# Patient Record
Sex: Female | Born: 1960
Health system: Southern US, Community
[De-identification: ages and names within clinical notes are randomized; demographics above are authoritative.]

## PROBLEM LIST (undated history)

## (undated) DIAGNOSIS — F410 Panic disorder [episodic paroxysmal anxiety] without agoraphobia: Secondary | ICD-10-CM

## (undated) DIAGNOSIS — A048 Other specified bacterial intestinal infections: Secondary | ICD-10-CM

## (undated) DIAGNOSIS — G43909 Migraine, unspecified, not intractable, without status migrainosus: Secondary | ICD-10-CM

## (undated) DIAGNOSIS — T8859XA Other complications of anesthesia, initial encounter: Secondary | ICD-10-CM

## (undated) DIAGNOSIS — M810 Age-related osteoporosis without current pathological fracture: Secondary | ICD-10-CM

## (undated) DIAGNOSIS — G4700 Insomnia, unspecified: Secondary | ICD-10-CM

## (undated) DIAGNOSIS — I10 Essential (primary) hypertension: Secondary | ICD-10-CM

## (undated) DIAGNOSIS — E559 Vitamin D deficiency, unspecified: Secondary | ICD-10-CM

## (undated) DIAGNOSIS — Z9889 Other specified postprocedural states: Secondary | ICD-10-CM

## (undated) DIAGNOSIS — M1990 Unspecified osteoarthritis, unspecified site: Secondary | ICD-10-CM

## (undated) DIAGNOSIS — G510 Bell's palsy: Secondary | ICD-10-CM

## (undated) DIAGNOSIS — M4120 Other idiopathic scoliosis, site unspecified: Secondary | ICD-10-CM

## (undated) DIAGNOSIS — K219 Gastro-esophageal reflux disease without esophagitis: Secondary | ICD-10-CM

## (undated) DIAGNOSIS — D638 Anemia in other chronic diseases classified elsewhere: Secondary | ICD-10-CM

## (undated) HISTORY — DX: Anemia in other chronic diseases classified elsewhere: D63.8

## (undated) HISTORY — DX: Other complications of anesthesia, initial encounter: T88.59XA

## (undated) HISTORY — DX: Insomnia, unspecified: G47.00

## (undated) HISTORY — DX: Other specified postprocedural states: Z98.890

## (undated) HISTORY — DX: Other specified bacterial intestinal infections: A04.8

## (undated) HISTORY — DX: Unspecified osteoarthritis, unspecified site: M19.90

## (undated) HISTORY — PX: GASTRIC BYPASS: SHX52

## (undated) HISTORY — DX: Gastro-esophageal reflux disease without esophagitis: K21.9

## (undated) HISTORY — PX: CHOLECYSTECTOMY: SHX55

## (undated) HISTORY — PX: BACK SURGERY: SHX140

## (undated) HISTORY — DX: Panic disorder (episodic paroxysmal anxiety): F41.0

## (undated) HISTORY — DX: Essential (primary) hypertension: I10

## (undated) HISTORY — PX: VAGINAL HYSTERECTOMY: SUR661

## (undated) HISTORY — DX: Bell's palsy: G51.0

## (undated) HISTORY — PX: TONSILLECTOMY: SUR1361

## (undated) HISTORY — DX: Age-related osteoporosis without current pathological fracture: M81.0

## (undated) HISTORY — DX: Vitamin D deficiency, unspecified: E55.9

## (undated) HISTORY — DX: Migraine, unspecified, not intractable, without status migrainosus: G43.909

## (undated) HISTORY — DX: Other idiopathic scoliosis, site unspecified: M41.20

---

## 2015-02-07 ENCOUNTER — Other Ambulatory Visit: Payer: Self-pay | Admitting: Family Medicine

## 2015-02-07 DIAGNOSIS — Z1231 Encounter for screening mammogram for malignant neoplasm of breast: Secondary | ICD-10-CM

## 2015-02-07 DIAGNOSIS — M810 Age-related osteoporosis without current pathological fracture: Secondary | ICD-10-CM

## 2015-03-07 ENCOUNTER — Other Ambulatory Visit: Payer: Self-pay

## 2015-03-07 ENCOUNTER — Ambulatory Visit: Payer: Self-pay

## 2015-03-10 ENCOUNTER — Ambulatory Visit
Admission: RE | Admit: 2015-03-10 | Discharge: 2015-03-10 | Disposition: A | Discharge status: Home / Self Care | Payer: 59 | Source: Ambulatory Visit | Attending: Family Medicine | Admitting: Family Medicine

## 2015-03-10 DIAGNOSIS — Z1231 Encounter for screening mammogram for malignant neoplasm of breast: Secondary | ICD-10-CM

## 2015-03-10 DIAGNOSIS — M810 Age-related osteoporosis without current pathological fracture: Secondary | ICD-10-CM

## 2015-03-10 IMAGING — MG MM SCREEN MAMMOGRAM BILATERAL
4 series · 4 of 4 positions shown · non-contrast
Comparison: Previous exam(s).

CLINICAL DATA: Screening.

EXAM:
DIGITAL SCREENING BILATERAL MAMMOGRAM WITH CAD

[R CC]
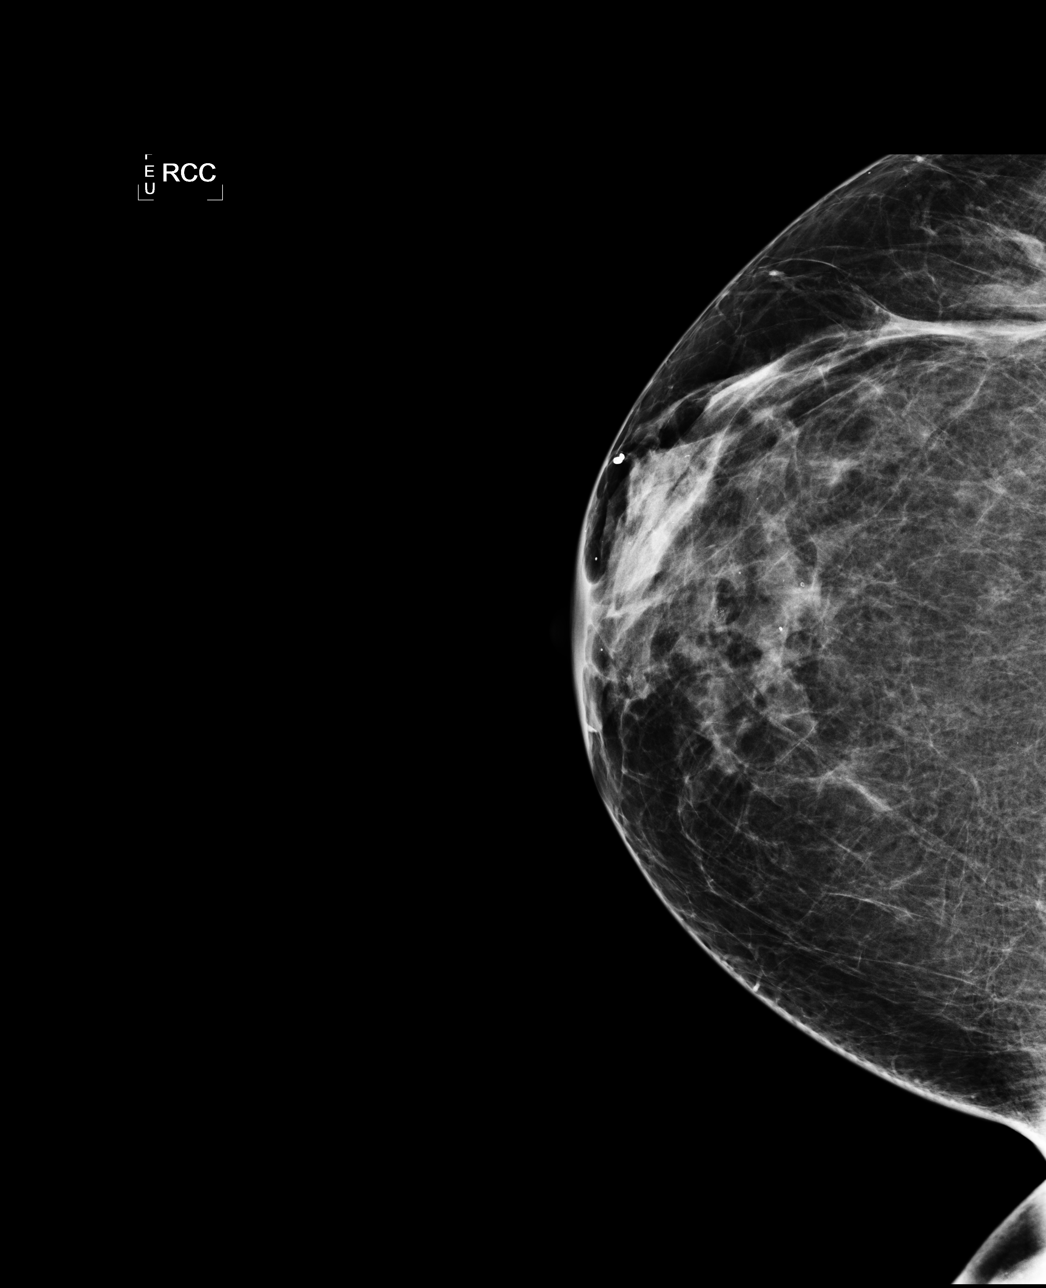

[L CC]
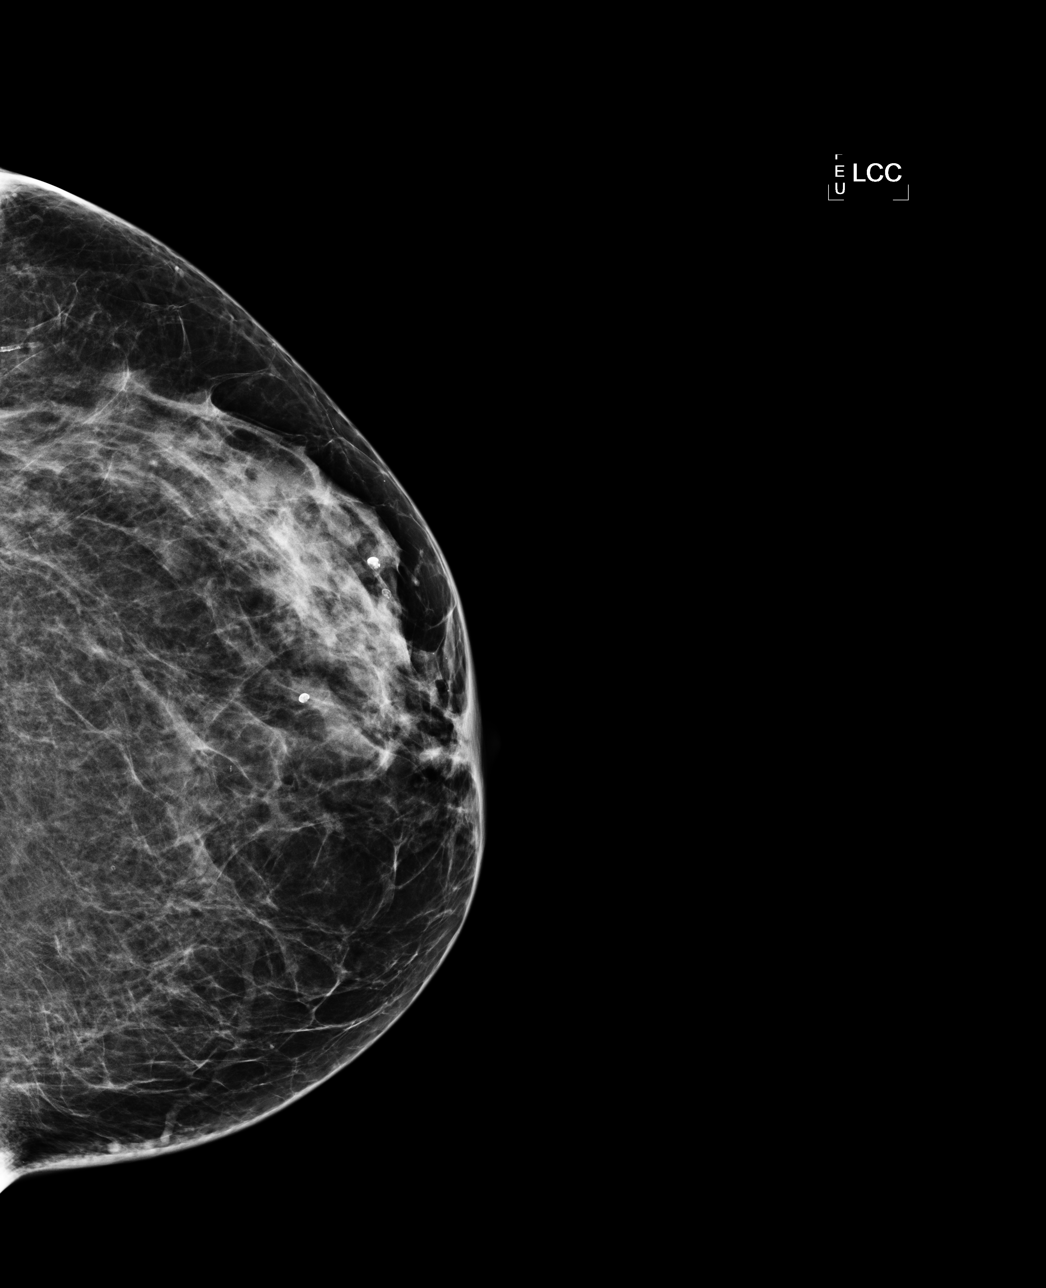

[L MLO]
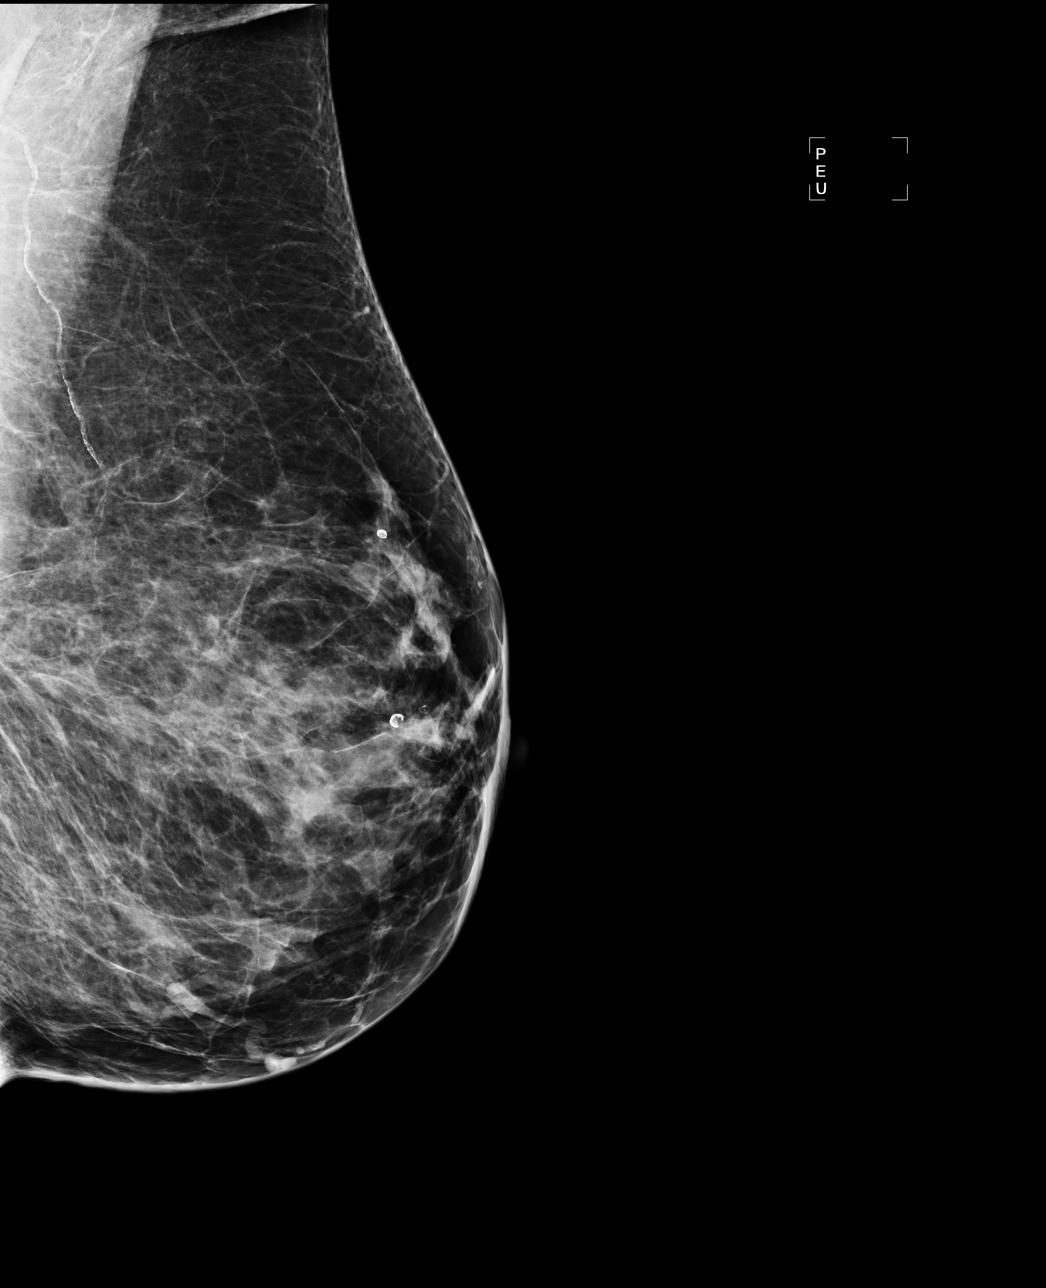

[R MLO]
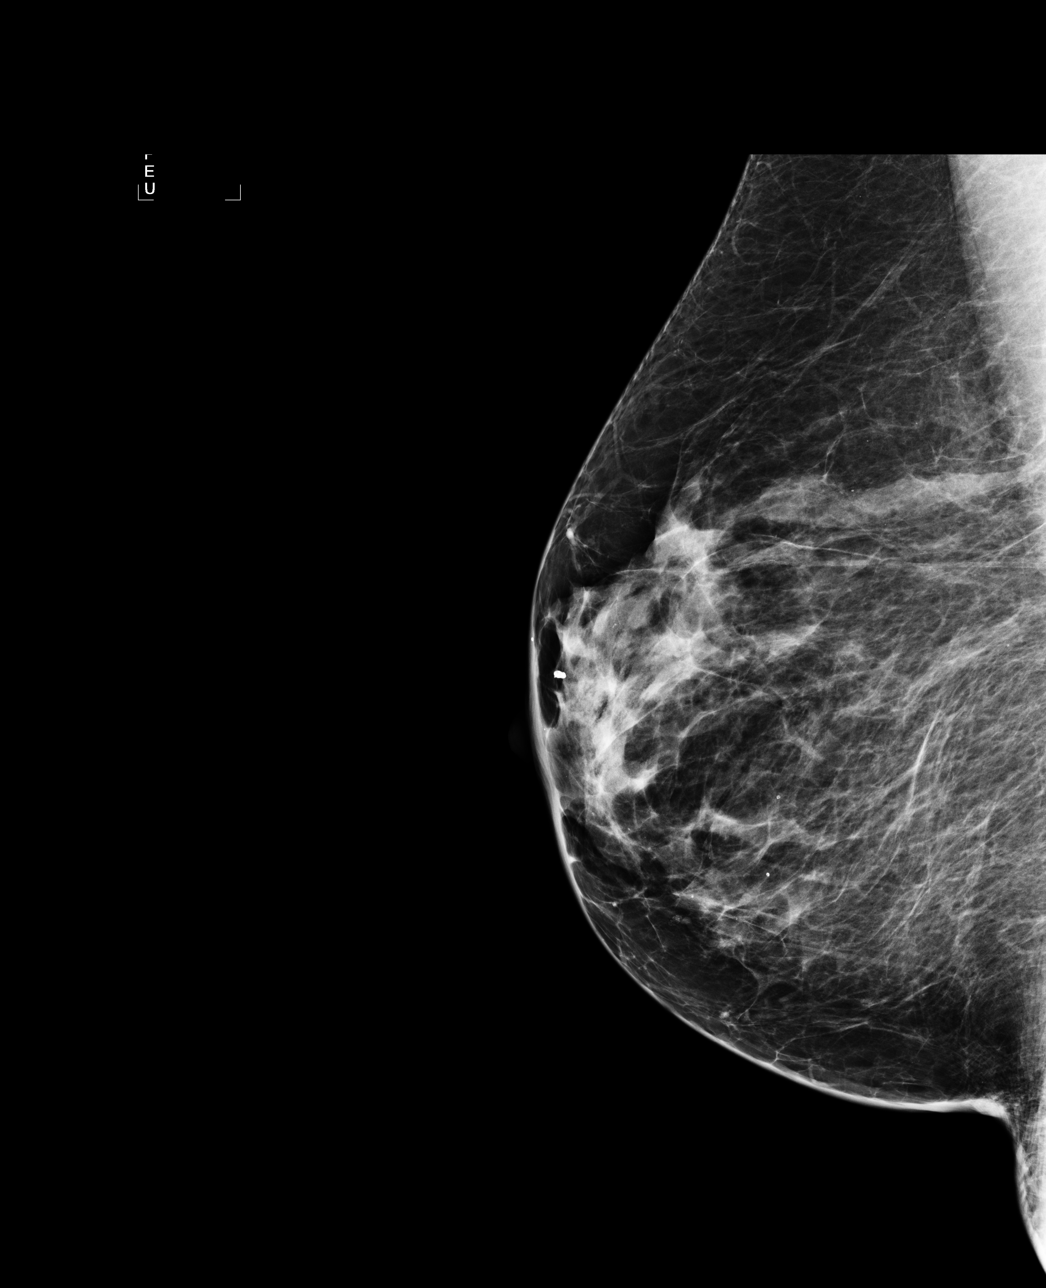

[4 of 4 positions shown; findings below may reference images not displayed]

ACR Breast Density Category c: The breast tissue is heterogeneously
dense, which may obscure small masses.
FINDINGS: There are no findings suspicious for malignancy. Images were
processed with CAD.
IMPRESSION: No mammographic evidence of malignancy. A result letter of this
screening mammogram will be mailed directly to the patient.

RECOMMENDATION:
Screening mammogram in one year. (Code:YJ-2-FEZ)

BI-RADS CATEGORY  1: Negative.

## 2015-09-05 MED FILL — METHOCARBAMOL 750 MG TABLET: 750 | 30 days supply | Qty: 90 | Fill #2

## 2015-09-07 MED FILL — LORazepam 0.5 MG TABS: 0.5 | 10 days supply | Qty: 30 | Fill #0

## 2015-09-07 MED FILL — LYRICA 150 MG CAPSULE: 150 | 30 days supply | Qty: 60 | Fill #0

## 2015-10-03 MED FILL — LYRICA 150 MG CAPSULE: 150 | 30 days supply | Qty: 60 | Fill #1

## 2015-10-03 MED FILL — LOSARTAN POTASSIUM 50 MG TA: 50 | 90 days supply | Qty: 90 | Fill #2

## 2015-10-03 MED FILL — LORazepam 0.5 MG TABS: 0.5 | 10 days supply | Qty: 30 | Fill #1

## 2015-10-03 MED FILL — METHOCARBAMOL 750 MG TABLET: 750 | 30 days supply | Qty: 90 | Fill #3

## 2015-10-24 MED FILL — HYDROCODON-APAP 5-325: 5-325 | 15 days supply | Qty: 60 | Fill #0

## 2015-11-02 MED FILL — LYRICA 150 MG CAPSULE: 150 | 30 days supply | Qty: 60 | Fill #2

## 2015-11-02 MED FILL — ZOLPIDEM TARTRATE 10 MG TAB: 10 | 30 days supply | Qty: 30 | Fill #0

## 2015-11-03 MED FILL — SUMATRIPTAN SUCC 100 MG TAB: 100 | 90 days supply | Qty: 27 | Fill #0

## 2015-11-03 MED FILL — LORazepam 0.5 MG TABS: 0.5 | 10 days supply | Qty: 30 | Fill #0

## 2015-11-09 MED FILL — METHOCARBAMOL 750 MG TABLET: 750 | 30 days supply | Qty: 90 | Fill #0

## 2015-11-24 MED FILL — HYDROCODON-APAP 5-325: 5-325 | 15 days supply | Qty: 60 | Fill #0

## 2015-12-02 MED FILL — LYRICA 150 MG CAPSULE: 150 | 30 days supply | Qty: 60 | Fill #3

## 2015-12-02 MED FILL — ZOLPIDEM TARTRATE 10 MG TAB: 10 | 30 days supply | Qty: 30 | Fill #1

## 2015-12-02 MED FILL — LORazepam 0.5 MG TABS: 0.5 | 10 days supply | Qty: 30 | Fill #1

## 2015-12-09 MED FILL — METHOCARBAMOL 750 MG TABLET: 750 | 30 days supply | Qty: 90 | Fill #1

## 2015-12-14 DIAGNOSIS — M810 Age-related osteoporosis without current pathological fracture: Secondary | ICD-10-CM | POA: Diagnosis not present

## 2015-12-14 DIAGNOSIS — D638 Anemia in other chronic diseases classified elsewhere: Secondary | ICD-10-CM | POA: Diagnosis not present

## 2015-12-14 DIAGNOSIS — R0789 Other chest pain: Secondary | ICD-10-CM | POA: Diagnosis not present

## 2015-12-14 DIAGNOSIS — F430 Acute stress reaction: Secondary | ICD-10-CM | POA: Diagnosis not present

## 2015-12-14 DIAGNOSIS — K219 Gastro-esophageal reflux disease without esophagitis: Secondary | ICD-10-CM | POA: Diagnosis not present

## 2015-12-16 MED FILL — FERROUS SULFATE 325 MG TAB: 325 (65 FE) | 100 days supply | Qty: 200 | Fill #0

## 2015-12-19 MED FILL — ALENDRONATE NA 70 MG TAB: 70 | 28 days supply | Qty: 4 | Fill #2

## 2016-01-02 MED FILL — LOSARTAN POTASSIUM 50 MG TA: 50 | 60 days supply | Qty: 60 | Fill #3

## 2016-01-02 MED FILL — LORazepam 0.5 MG TABS: 0.5 | 10 days supply | Qty: 30 | Fill #2

## 2016-01-02 MED FILL — LYRICA 150 MG CAPSULE: 150 | 30 days supply | Qty: 60 | Fill #4

## 2016-01-02 MED FILL — ZOLPIDEM TARTRATE 10 MG TAB: 10 | 30 days supply | Qty: 30 | Fill #2

## 2016-01-04 MED FILL — METHOCARBAMOL 750 MG TABLET: 750 | 30 days supply | Qty: 90 | Fill #2

## 2016-01-04 MED FILL — HYDROCODON-APAP 5-325: 5-325 | 15 days supply | Qty: 60 | Fill #0

## 2016-01-12 DIAGNOSIS — D638 Anemia in other chronic diseases classified elsewhere: Secondary | ICD-10-CM | POA: Diagnosis not present

## 2016-01-16 MED FILL — PHENTERMINE 37.5 MG TABLET: 37.5 | 30 days supply | Qty: 30 | Fill #0

## 2016-02-06 MED FILL — SUMATRIPTAN SUCC 100 MG TAB: 100 | 90 days supply | Qty: 27 | Fill #1

## 2016-02-06 MED FILL — LORazepam 0.5 MG TABS: 0.5 | 10 days supply | Qty: 30 | Fill #3

## 2016-02-06 MED FILL — LYRICA 150 MG CAPSULE: 150 | 30 days supply | Qty: 60 | Fill #5

## 2016-02-06 MED FILL — ZOLPIDEM TARTRATE 10 MG TAB: 10 | 30 days supply | Qty: 30 | Fill #3

## 2016-02-06 MED FILL — HYDROCODON-APAP 5-325: 5-325 | 15 days supply | Qty: 60 | Fill #0

## 2016-02-06 MED FILL — METHOCARBAMOL 750 MG TABLET: 750 | 30 days supply | Qty: 90 | Fill #3

## 2016-02-06 MED FILL — ALENDRONATE NA 70 MG TAB: 70 | 28 days supply | Qty: 4 | Fill #3

## 2016-02-06 MED FILL — FERROUS SULFATE 325 MG TAB: 325 (65 FE) | 100 days supply | Qty: 200 | Fill #0

## 2016-02-13 DIAGNOSIS — E559 Vitamin D deficiency, unspecified: Secondary | ICD-10-CM | POA: Diagnosis not present

## 2016-02-13 DIAGNOSIS — I10 Essential (primary) hypertension: Secondary | ICD-10-CM | POA: Diagnosis not present

## 2016-02-13 DIAGNOSIS — D638 Anemia in other chronic diseases classified elsewhere: Secondary | ICD-10-CM | POA: Diagnosis not present

## 2016-02-16 MED FILL — PHENTERMINE 37.5 MG TABLET: 37.5 | 30 days supply | Qty: 30 | Fill #0

## 2016-03-05 MED FILL — HYDROCODON-APAP 5-325: 5-325 | 15 days supply | Qty: 60 | Fill #0

## 2016-03-07 MED FILL — LORazepam 0.5 MG TABS: 0.5 | 10 days supply | Qty: 30 | Fill #0

## 2016-03-07 MED FILL — METHOCARBAMOL 750 MG TABLET: 750 | 30 days supply | Qty: 90 | Fill #0

## 2016-03-07 MED FILL — LYRICA 150 MG CAPSULE: 150 | 30 days supply | Qty: 60 | Fill #0

## 2016-03-07 MED FILL — ZOLPIDEM TARTRATE 10 MG TAB: 10 | 30 days supply | Qty: 30 | Fill #0

## 2016-03-07 MED FILL — ALENDRONATE NA 70 MG TAB: 70 | 28 days supply | Qty: 4 | Fill #4

## 2016-03-21 MED FILL — LOSARTAN POTASSIUM 50 MG TA: 50 | 90 days supply | Qty: 90 | Fill #0

## 2016-03-22 MED FILL — PHENTERMINE 37.5 MG TABLET: 37.5 | 30 days supply | Qty: 30 | Fill #0

## 2016-04-04 MED FILL — ZOLPIDEM TARTRATE 10 MG TAB: 10 | 30 days supply | Qty: 30 | Fill #1

## 2016-04-04 MED FILL — LORazepam 0.5 MG TABS: 0.5 | 10 days supply | Qty: 30 | Fill #1

## 2016-04-04 MED FILL — LYRICA 150 MG CAPSULE: 150 | 30 days supply | Qty: 60 | Fill #1

## 2016-04-04 MED FILL — METHOCARBAMOL 750 MG TABLET: 750 | 30 days supply | Qty: 90 | Fill #1

## 2016-04-16 MED FILL — HYDROCODON-APAP 5-325: 5-325 | 15 days supply | Qty: 60 | Fill #0

## 2016-04-20 MED FILL — PHENTERMINE 37.5 MG TABLET: 37.5 | 30 days supply | Qty: 30 | Fill #0

## 2016-05-04 MED FILL — METHOCARBAMOL 750 MG TABLET: 750 | 30 days supply | Qty: 90 | Fill #2

## 2016-05-04 MED FILL — FERROUS SULFATE 325 MG TAB: 325 (65 FE) | 100 days supply | Qty: 200 | Fill #1

## 2016-05-04 MED FILL — SUMATRIPTAN SUCC 100 MG TAB: 100 | 90 days supply | Qty: 27 | Fill #2

## 2016-05-04 MED FILL — ZOLPIDEM TARTRATE 10 MG TAB: 10 | 30 days supply | Qty: 30 | Fill #2

## 2016-05-04 MED FILL — LORazepam 0.5 MG TABS: 0.5 | 10 days supply | Qty: 30 | Fill #2

## 2016-05-04 MED FILL — LYRICA 150 MG CAPSULE: 150 | 30 days supply | Qty: 60 | Fill #2

## 2016-05-25 MED FILL — HYDROCODON-APAP 5-325: 5-325 | 15 days supply | Qty: 60 | Fill #0

## 2016-06-07 MED FILL — LYRICA 150 MG CAPSULE: 150 | 30 days supply | Qty: 60 | Fill #3

## 2016-06-07 MED FILL — LORazepam 0.5 MG TABS: 0.5 | 10 days supply | Qty: 30 | Fill #3

## 2016-06-07 MED FILL — METHOCARBAMOL 750 MG TABLET: 750 | 30 days supply | Qty: 90 | Fill #3

## 2016-06-07 MED FILL — LOSARTAN POTASSIUM 50 MG TA: 50 | 90 days supply | Qty: 90 | Fill #1

## 2016-06-07 MED FILL — FERROUS SULFATE 325 MG TAB: 325 (65 FE) | 100 days supply | Qty: 200 | Fill #1

## 2016-06-07 MED FILL — PHENTERMINE 37.5 MG TABLET: 37.5 | 30 days supply | Qty: 30 | Fill #0

## 2016-06-27 MED FILL — HYDROCODON-APAP 5-325: 5-325 | 15 days supply | Qty: 60 | Fill #0

## 2016-06-27 MED FILL — PHENTERMINE 37.5 MG TABLET: 37.5 | 30 days supply | Qty: 30 | Fill #0

## 2016-07-05 MED FILL — LORazepam 0.5 MG TABS: 0.5 | 10 days supply | Qty: 30 | Fill #0

## 2016-07-05 MED FILL — METHOCARBAMOL 750 MG TABLET: 750 | 30 days supply | Qty: 90 | Fill #0

## 2016-07-05 MED FILL — LYRICA 150 MG CAPSULE: 150 | 30 days supply | Qty: 60 | Fill #4

## 2016-07-13 MED FILL — ALENDRONATE NA 70 MG TAB: 70 | 28 days supply | Qty: 4 | Fill #0

## 2016-07-13 MED FILL — ZOLPIDEM TARTRATE 10 MG TAB: 10 | 30 days supply | Qty: 30 | Fill #3

## 2016-07-31 MED FILL — HYDROCODON-APAP 5-325: 5-325 | 15 days supply | Qty: 60 | Fill #0

## 2016-08-08 MED FILL — LORazepam 0.5 MG TABS: 0.5 | 10 days supply | Qty: 30 | Fill #1

## 2016-08-08 MED FILL — LYRICA 150 MG CAPSULE: 150 | 30 days supply | Qty: 60 | Fill #5

## 2016-08-08 MED FILL — ZOLPIDEM TARTRATE 10 MG TAB: 10 | 30 days supply | Qty: 30 | Fill #4

## 2016-08-08 MED FILL — SUMATRIPTAN SUCC 100 MG TAB: 100 | 90 days supply | Qty: 27 | Fill #3

## 2016-08-08 MED FILL — METHOCARBAMOL 750 MG TABLET: 750 | 30 days supply | Qty: 90 | Fill #1

## 2016-08-10 MED FILL — PHENTERMINE 37.5 MG TABLET: 37.5 | 30 days supply | Qty: 30 | Fill #0

## 2016-09-07 MED FILL — LOSARTAN POTASSIUM 50 MG TA: 50 | 90 days supply | Qty: 90 | Fill #2

## 2016-09-07 MED FILL — ZOLPIDEM TARTRATE 10 MG TAB: 10 | 30 days supply | Qty: 30 | Fill #0

## 2016-09-07 MED FILL — METHOCARBAMOL 750 MG TABLET: 750 | 30 days supply | Qty: 90 | Fill #2

## 2016-09-07 MED FILL — LORazepam 0.5 MG TABS: 0.5 | 10 days supply | Qty: 30 | Fill #2

## 2016-09-07 MED FILL — LYRICA 150 MG CAPSULE: 150 | 30 days supply | Qty: 60 | Fill #0

## 2016-09-11 MED FILL — MECLIZINE 25 MG TABLET: 25 | 10 days supply | Qty: 30 | Fill #0

## 2016-09-19 DIAGNOSIS — K219 Gastro-esophageal reflux disease without esophagitis: Secondary | ICD-10-CM | POA: Diagnosis not present

## 2016-09-19 DIAGNOSIS — R0789 Other chest pain: Secondary | ICD-10-CM | POA: Diagnosis not present

## 2016-09-19 DIAGNOSIS — D638 Anemia in other chronic diseases classified elsewhere: Secondary | ICD-10-CM | POA: Diagnosis not present

## 2016-09-19 DIAGNOSIS — I10 Essential (primary) hypertension: Secondary | ICD-10-CM | POA: Diagnosis not present

## 2016-09-24 MED FILL — HYDROCODON-APAP 5-325: 5-325 | 15 days supply | Qty: 60 | Fill #0

## 2016-09-24 MED FILL — PHENTERMINE 37.5 MG TABLET: 37.5 | 30 days supply | Qty: 30 | Fill #0

## 2016-09-25 MED FILL — SCOPOLAMINE 1 MG/3DAYS PTCH: 1 | 12 days supply | Qty: 4 | Fill #0

## 2016-10-03 MED FILL — LYRICA 150 MG CAPSULE: 150 | 30 days supply | Qty: 60 | Fill #1

## 2016-10-03 MED FILL — ZOLPIDEM TARTRATE 10 MG TAB: 10 | 30 days supply | Qty: 30 | Fill #1

## 2016-10-03 MED FILL — LOSARTAN POTASSIUM 100 MG T: 100 | 90 days supply | Qty: 90 | Fill #0

## 2016-10-03 MED FILL — METHOCARBAMOL 750 MG TABLET: 750 | 30 days supply | Qty: 90 | Fill #3

## 2016-10-05 MED FILL — LORazepam 0.5 MG TABS: 0.5 | 10 days supply | Qty: 30 | Fill #3

## 2016-10-22 MED FILL — HYDROCODON-APAP 5-325: 5-325 | 15 days supply | Qty: 60 | Fill #0

## 2016-11-02 MED FILL — SUMATRIPTAN SUCC 100 MG TAB: 100 | 90 days supply | Qty: 27 | Fill #4

## 2016-11-02 MED FILL — LYRICA 150 MG CAPSULE: 150 | 30 days supply | Qty: 60 | Fill #2

## 2016-11-02 MED FILL — LORazepam 0.5 MG TABS: 0.5 | 10 days supply | Qty: 30 | Fill #0

## 2016-11-02 MED FILL — ZOLPIDEM TARTRATE 10 MG TAB: 10 | 30 days supply | Qty: 30 | Fill #2

## 2016-11-05 MED FILL — HYDROCODON-APAP 5-325: 5-325 | 15 days supply | Qty: 60 | Fill #0

## 2016-11-07 MED FILL — METHOCARBAMOL 750 MG TABLET: 750 | 30 days supply | Qty: 90 | Fill #0

## 2016-11-28 MED FILL — PHENTERMINE 37.5 MG TABLET: 37.5 | 30 days supply | Qty: 30 | Fill #0

## 2016-11-28 MED FILL — LORazepam 0.5 MG TABS: 0.5 | 10 days supply | Qty: 30 | Fill #1

## 2016-12-03 MED FILL — LYRICA 150 MG CAPSULE: 150 | 30 days supply | Qty: 60 | Fill #3

## 2016-12-03 MED FILL — ZOLPIDEM TARTRATE 10 MG TAB: 10 | 30 days supply | Qty: 30 | Fill #3

## 2016-12-05 MED FILL — METHOCARBAMOL 750 MG TABLET: 750 | 30 days supply | Qty: 90 | Fill #1

## 2016-12-05 MED FILL — HYDROCODON-APAP 5-325: 5-325 | 15 days supply | Qty: 60 | Fill #0

## 2017-01-09 MED FILL — METHOCARBAMOL 750 MG TABLET: 750 | 30 days supply | Qty: 90 | Fill #2

## 2017-01-09 MED FILL — HYDROCODON-APAP 5-325: 5-325 | 15 days supply | Qty: 60 | Fill #0

## 2017-01-09 MED FILL — LYRICA 150 MG CAPSULE: 150 | 30 days supply | Qty: 60 | Fill #4

## 2017-01-09 MED FILL — LORazepam 0.5 MG TABS: 0.5 | 10 days supply | Qty: 30 | Fill #2

## 2017-01-09 MED FILL — LOSARTAN POTASSIUM 100 MG T: 100 | 90 days supply | Qty: 90 | Fill #0

## 2017-01-30 MED FILL — LORazepam 0.5 MG TABS: 0.5 | 10 days supply | Qty: 30 | Fill #3

## 2017-01-30 MED FILL — ZOLPIDEM TARTRATE 10 MG TAB: 10 | 30 days supply | Qty: 30 | Fill #4

## 2017-01-30 MED FILL — PHENTERMINE 37.5 MG TABLET: 37.5 | 30 days supply | Qty: 30 | Fill #0

## 2017-01-30 MED FILL — SUMATRIPTAN SUCC 100 MG TAB: 100 | 90 days supply | Qty: 27 | Fill #0

## 2017-02-06 MED FILL — METHOCARBAMOL 750 MG TABLET: 750 | 30 days supply | Qty: 90 | Fill #3

## 2017-02-06 MED FILL — LYRICA 150 MG CAPSULE: 150 | 30 days supply | Qty: 60 | Fill #5

## 2017-02-18 MED FILL — HYDROCODON-APAP 5-325: 5-325 | 15 days supply | Qty: 60 | Fill #0

## 2017-03-20 MED FILL — METHOCARBAMOL 750 MG TABLET: 750 | 30 days supply | Qty: 90 | Fill #0

## 2017-03-20 MED FILL — LORazepam 0.5 MG TABS: 0.5 | 10 days supply | Qty: 30 | Fill #0

## 2017-03-20 MED FILL — ZOLPIDEM TARTRATE 10 MG TAB: 10 | 30 days supply | Qty: 30 | Fill #0

## 2017-03-21 MED FILL — LYRICA 150 MG CAPSULE: 150 | 30 days supply | Qty: 60 | Fill #0

## 2017-03-22 MED FILL — HYDROCODON-APAP 5-325: 5-325 | 15 days supply | Qty: 60 | Fill #0

## 2017-04-08 MED FILL — LOSARTAN POTASSIUM 100 MG T: 100 | 90 days supply | Qty: 90 | Fill #1 | Status: TO

## 2017-04-23 MED FILL — HYDROCODON-APAP 5-325: 5-325 | 15 days supply | Qty: 60 | Fill #0

## 2017-04-24 MED FILL — ZOLPIDEM TARTRATE 10 MG TAB: 10 | 30 days supply | Qty: 30 | Fill #1

## 2017-04-24 MED FILL — SUMATRIPTAN SUCC 100 MG TAB: 100 | 90 days supply | Qty: 27 | Fill #1 | Status: TO

## 2017-04-24 MED FILL — LORazepam 0.5 MG TABS: 0.5 | 10 days supply | Qty: 30 | Fill #1

## 2017-04-24 MED FILL — METHOCARBAMOL 750 MG TABLET: 750 | 30 days supply | Qty: 90 | Fill #1

## 2017-04-24 MED FILL — LYRICA 150 MG CAPSULE: 150 | 30 days supply | Qty: 60 | Fill #1

## 2017-04-24 MED FILL — PHENTERMINE 37.5 MG TABLET: 37.5 | 30 days supply | Qty: 30 | Fill #0

## 2017-05-30 MED FILL — METHOCARBAMOL 750 MG TABLET: 750 | 30 days supply | Qty: 90 | Fill #2

## 2017-05-30 MED FILL — LYRICA 150 MG CAPSULE: 150 | 30 days supply | Qty: 60 | Fill #2

## 2017-05-30 MED FILL — LORazepam 0.5 MG TABS: 0.5 | 10 days supply | Qty: 30 | Fill #2

## 2017-05-30 MED FILL — ZOLPIDEM TARTRATE 10 MG TAB: 10 | 30 days supply | Qty: 30 | Fill #2

## 2017-06-07 ENCOUNTER — Other Ambulatory Visit: Payer: Self-pay | Admitting: Occupational Medicine

## 2017-06-07 ENCOUNTER — Ambulatory Visit: Payer: Self-pay

## 2017-06-07 DIAGNOSIS — M25562 Pain in left knee: Secondary | ICD-10-CM

## 2017-06-07 IMAGING — DX DG KNEE COMPLETE 4+V*L*
4 series · 4 of 4 positions shown · non-contrast
Comparison: None.

CLINICAL DATA: Fall on [DATE] with persistent left knee pain.

EXAM:
LEFT KNEE - COMPLETE 4+ VIEW

[knee ap]
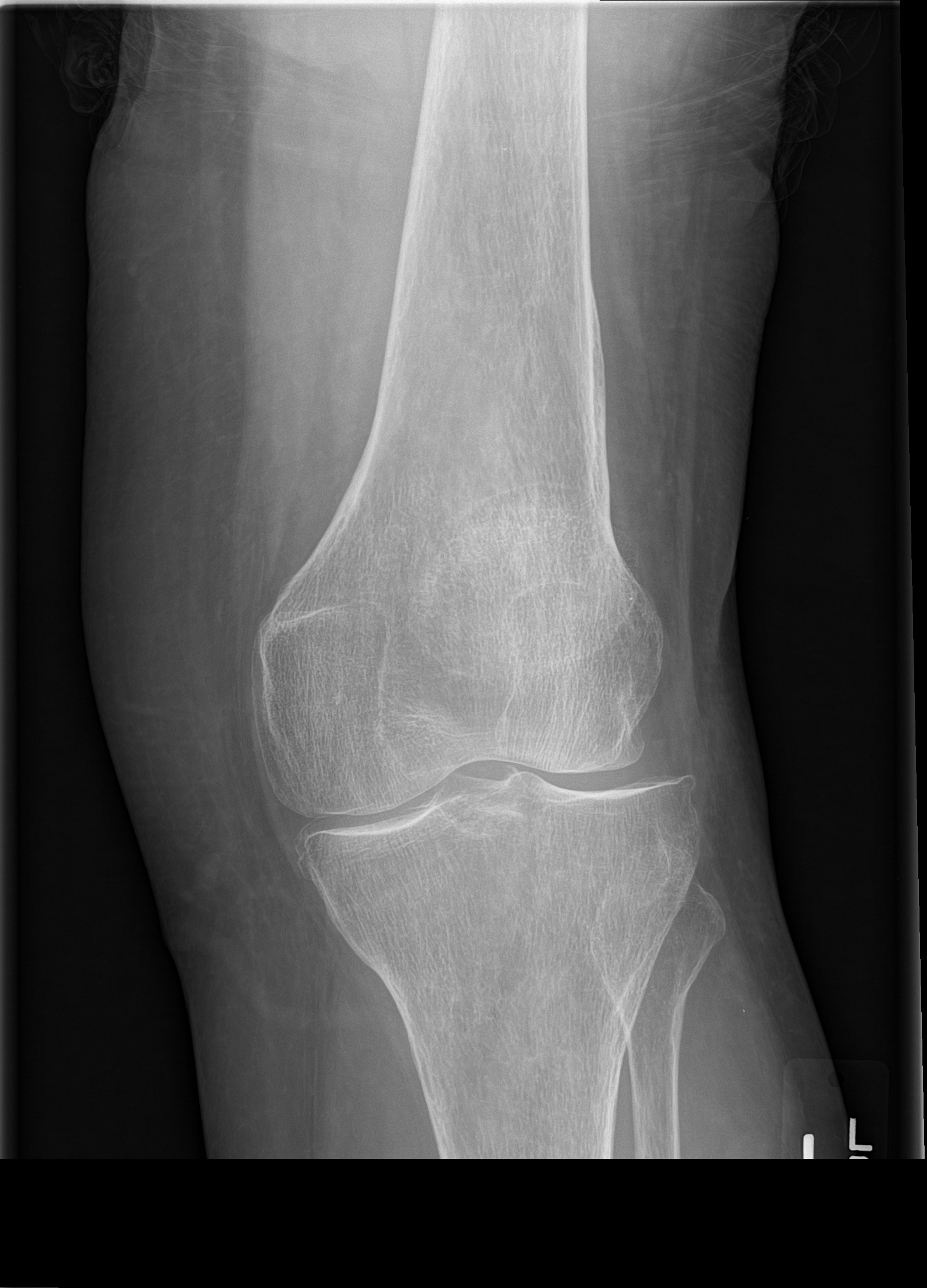

[knee obl (1 of 2)]
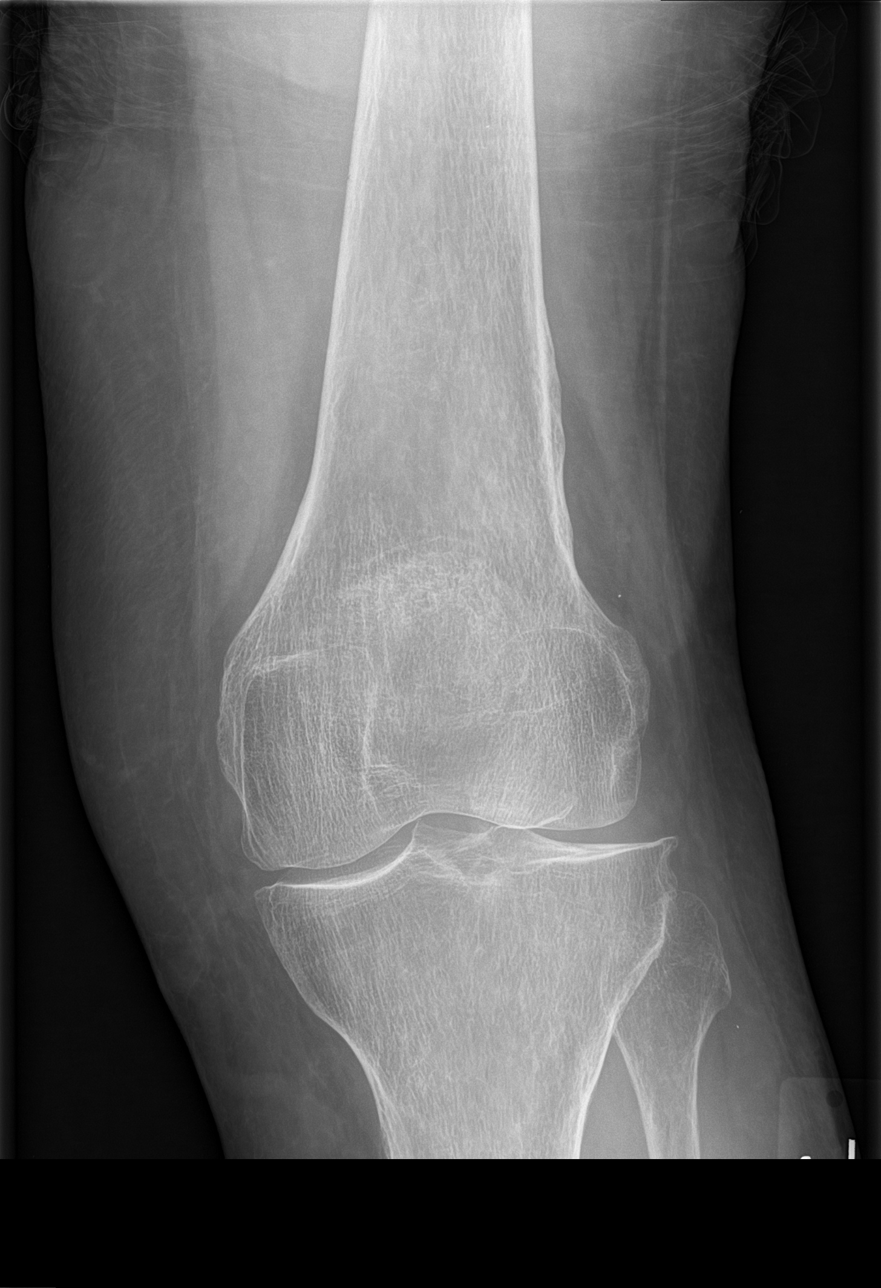

[knee obl (2 of 2)]
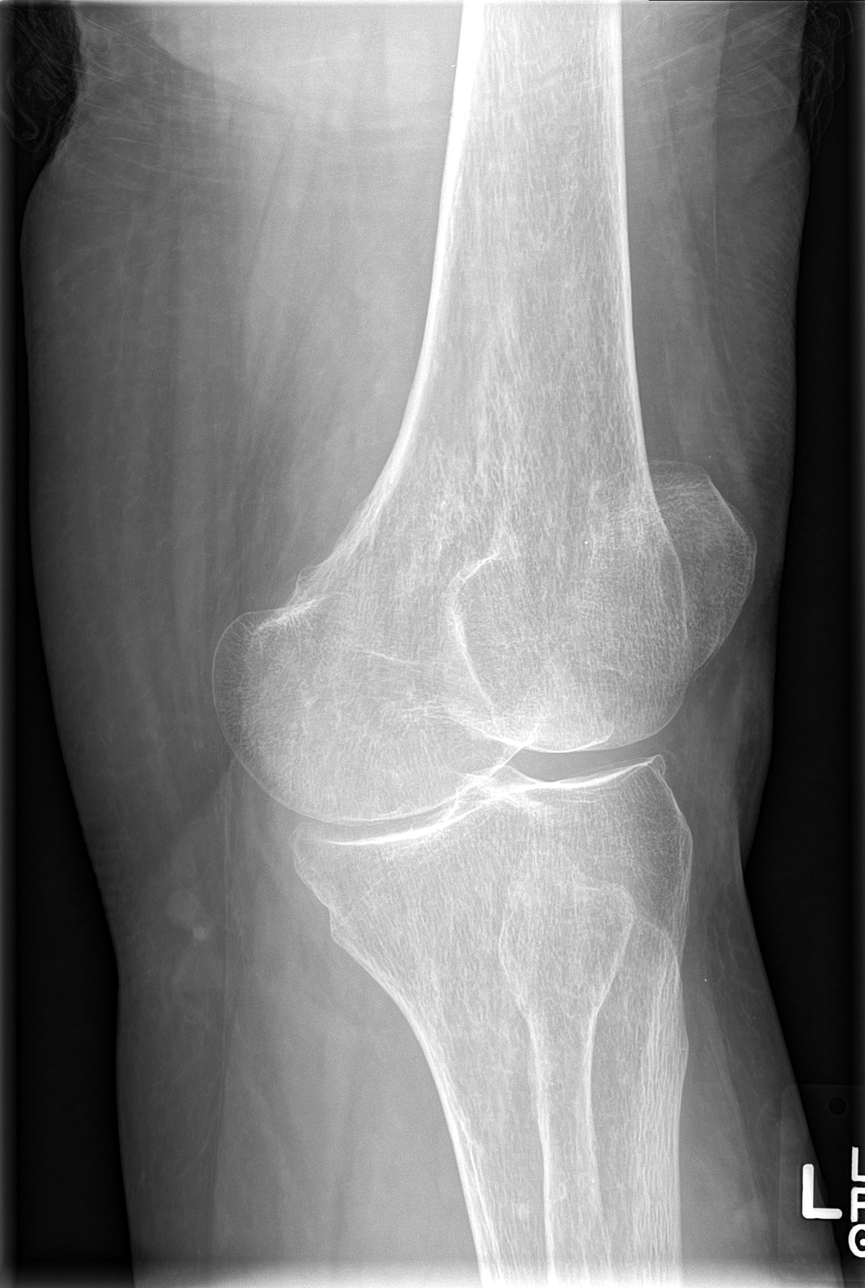

[knee lat]
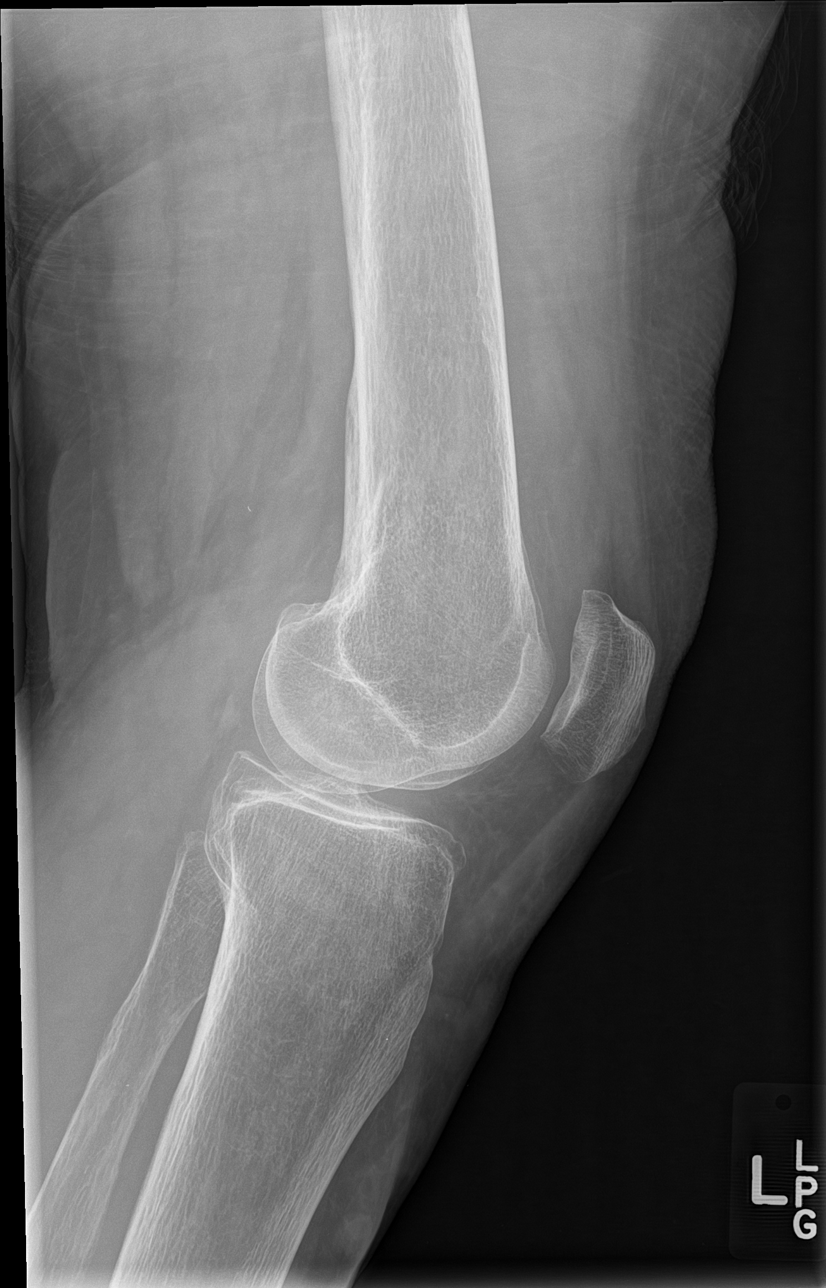

[4 of 4 positions shown; findings below may reference images not displayed]

FINDINGS: No evidence of acute fracture or dislocation. The a small amount of
joint fluid in the suprapatellar region. Mild generalized
osteopenia. Mild osteoarthritis with medial and lateral joint space
narrowing. Soft tissues are unremarkable.
IMPRESSION: No evidence of acute fracture. Probable small suprapatellar joint
effusion. Mild osteoarthritis.

## 2017-06-11 MED FILL — HYDROCODON-APAP 5-325: 5-325 | 15 days supply | Qty: 60 | Fill #0

## 2017-06-26 MED FILL — METHOCARBAMOL 750 MG TABLET: 750 | 30 days supply | Qty: 90 | Fill #0 | Status: TO

## 2017-06-26 MED FILL — FERROUS SULFATE 325 MG TAB: 325 (65 FE) | 100 days supply | Qty: 200 | Fill #0 | Status: TO

## 2017-06-26 MED FILL — ALENDRONATE NA 70 MG TAB: 70 | 28 days supply | Qty: 4 | Fill #1 | Status: TO

## 2017-06-26 MED FILL — LORazepam 0.5 MG TABS: 0.5 | 10 days supply | Qty: 30 | Fill #3

## 2017-06-28 MED FILL — LYRICA 150 MG CAPSULE: 150 | 30 days supply | Qty: 60 | Fill #3 | Status: TO

## 2017-06-28 MED FILL — ZOLPIDEM TARTRATE 10 MG TAB: 10 | 30 days supply | Qty: 30 | Fill #3 | Status: TO

## 2017-07-23 MED FILL — SUMATRIPTAN SUCC 100 MG TAB: 100 | 90 days supply | Qty: 27 | Fill #0

## 2017-07-23 MED FILL — LOSARTAN POTASSIUM 100 MG T: 100 | 90 days supply | Qty: 90 | Fill #0 | Status: TO

## 2017-07-23 MED FILL — METHOCARBAMOL 750 MG TABS: 750 | 30 days supply | Qty: 90 | Fill #0

## 2017-07-29 MED FILL — PHENTERMINE 37.5 MG TABLET: 37.5 | 30 days supply | Qty: 30 | Fill #0

## 2017-07-31 MED FILL — ZOLPIDEM TARTRATE 10 MG TAB: 10 | 30 days supply | Qty: 30 | Fill #0 | Status: TO

## 2017-07-31 MED FILL — HYDROCODON-APAP 5-325: 5-325 | 15 days supply | Qty: 60 | Fill #0

## 2017-07-31 MED FILL — LYRICA 150 MG CAPSULE: 150 | 30 days supply | Qty: 60 | Fill #0 | Status: TO

## 2017-08-12 MED FILL — ALENDRONATE NA 70 MG TAB: 70 | 28 days supply | Qty: 4 | Fill #0 | Status: TO

## 2017-08-22 MED FILL — METHOCARBAMOL 750 MG TABS: 750 | 30 days supply | Qty: 90 | Fill #1

## 2017-08-22 MED FILL — LORazepam 0.5 MG TABS: 0.5 | 10 days supply | Qty: 30 | Fill #0

## 2017-09-06 DIAGNOSIS — E559 Vitamin D deficiency, unspecified: Secondary | ICD-10-CM | POA: Diagnosis not present

## 2017-09-06 DIAGNOSIS — D638 Anemia in other chronic diseases classified elsewhere: Secondary | ICD-10-CM | POA: Diagnosis not present

## 2017-09-06 DIAGNOSIS — Z6831 Body mass index (BMI) 31.0-31.9, adult: Secondary | ICD-10-CM | POA: Diagnosis not present

## 2017-09-06 DIAGNOSIS — R51 Headache: Secondary | ICD-10-CM | POA: Diagnosis not present

## 2017-09-06 DIAGNOSIS — G500 Trigeminal neuralgia: Secondary | ICD-10-CM | POA: Diagnosis not present

## 2017-09-06 DIAGNOSIS — K219 Gastro-esophageal reflux disease without esophagitis: Secondary | ICD-10-CM | POA: Diagnosis not present

## 2017-09-06 DIAGNOSIS — I10 Essential (primary) hypertension: Secondary | ICD-10-CM | POA: Diagnosis not present

## 2017-09-06 MED FILL — HYDROCODON-APAP 5-325: 5-325 | 15 days supply | Qty: 60 | Fill #0

## 2017-09-06 MED FILL — ZOLPIDEM TARTRATE 10 MG TAB: 10 | 30 days supply | Qty: 30 | Fill #0

## 2017-09-06 MED FILL — BACLOFEN 10 MG TABS: 10 | 30 days supply | Qty: 90 | Fill #0 | Status: TO

## 2017-09-06 MED FILL — LYRICA 150 MG CAPSULE: 150 | 30 days supply | Qty: 60 | Fill #0

## 2017-09-06 MED FILL — ALENDRONATE NA 70 MG TAB: 70 | 28 days supply | Qty: 4 | Fill #0 | Status: TO

## 2017-09-18 MED FILL — OMEPRAZOLE 20 MG CAP: 20 | 90 days supply | Qty: 90 | Fill #0

## 2017-09-25 MED FILL — SUMATRIPTAN SUCC 100 MG TAB: 100 | 29 days supply | Qty: 9 | Fill #1 | Status: TO

## 2017-10-08 MED FILL — METHOCARBAMOL 750 MG TABS: 750 | 30 days supply | Qty: 90 | Fill #0

## 2017-10-14 MED FILL — ALENDRONATE NA 70 MG TAB: 70 | 28 days supply | Qty: 4 | Fill #0

## 2017-10-14 MED FILL — BACLOFEN 10 MG TABLET: 10 | 30 days supply | Qty: 90 | Fill #0

## 2017-10-14 MED FILL — FERROUS SULFATE 325 MG TAB: 325 (65 FE) | 100 days supply | Qty: 200 | Fill #0

## 2017-10-14 MED FILL — LYRICA 150 MG CAPSULE: 150 | 30 days supply | Qty: 60 | Fill #0

## 2017-10-14 MED FILL — LORazepam 0.5 MG TABS: 0.5 | 10 days supply | Qty: 30 | Fill #0 | Status: TO

## 2017-10-22 MED FILL — CHLORTHALIDONE 25 MG TAB: 25 | 30 days supply | Qty: 30 | Fill #0 | Status: TO

## 2017-10-22 MED FILL — LORazepam 0.5 MG TABS: 0.5 | 10 days supply | Qty: 30 | Fill #0 | Status: TO

## 2017-10-22 MED FILL — SUMATRIPTAN SUCC 100 MG TAB: 100 | 30 days supply | Qty: 8 | Fill #0 | Status: TO

## 2017-10-22 MED FILL — LOSARTAN POTASSIUM 100 MG T: 100 | 90 days supply | Qty: 90 | Fill #0

## 2017-10-22 MED FILL — HYDROCODON-APAP 5-325: 5-325 | 15 days supply | Qty: 60 | Fill #0

## 2017-10-24 DIAGNOSIS — R0789 Other chest pain: Secondary | ICD-10-CM | POA: Diagnosis not present

## 2017-10-24 DIAGNOSIS — K219 Gastro-esophageal reflux disease without esophagitis: Secondary | ICD-10-CM | POA: Diagnosis not present

## 2017-10-24 DIAGNOSIS — I10 Essential (primary) hypertension: Secondary | ICD-10-CM | POA: Diagnosis not present

## 2017-10-24 MED FILL — OMEPRAZOLE DR 40 MG CAPSULE: 40 | 90 days supply | Qty: 90 | Fill #0 | Status: TO

## 2017-10-25 MED FILL — LOSARTAN POTASSIUM 100 MG T: 100 | 90 days supply | Qty: 90 | Fill #0 | Status: TO

## 2017-11-10 MED FILL — ALENDRONATE NA 70 MG TAB: 70 | 28 days supply | Qty: 4 | Fill #1

## 2017-11-13 MED FILL — LYRICA 150 MG CAPSULE: 150 | 30 days supply | Qty: 60 | Fill #1

## 2017-11-13 MED FILL — LORazepam 0.5 MG TABS: 0.5 | 10 days supply | Qty: 30 | Fill #0

## 2017-11-15 MED FILL — METHOCARBAMOL 750 MG TABS: 750 | 30 days supply | Qty: 90 | Fill #1

## 2017-11-20 MED FILL — CHLORTHALIDONE 25 MG TAB: 25 | 30 days supply | Qty: 30 | Fill #0

## 2017-11-28 DIAGNOSIS — K219 Gastro-esophageal reflux disease without esophagitis: Secondary | ICD-10-CM | POA: Diagnosis not present

## 2017-11-28 DIAGNOSIS — E559 Vitamin D deficiency, unspecified: Secondary | ICD-10-CM | POA: Diagnosis not present

## 2017-11-28 DIAGNOSIS — R51 Headache: Secondary | ICD-10-CM | POA: Diagnosis not present

## 2017-11-28 DIAGNOSIS — I10 Essential (primary) hypertension: Secondary | ICD-10-CM | POA: Diagnosis not present

## 2017-12-09 MED FILL — PHENTERMINE 37.5 MG TABLET: 37.5 | 30 days supply | Qty: 30 | Fill #0

## 2017-12-09 MED FILL — TRANSDERM-SCOP 1.5 MG/3 DAY: 1 | 12 days supply | Qty: 4 | Fill #0 | Status: TO

## 2017-12-11 MED FILL — HYDROCODON-APAP 5-325: 5-325 | 15 days supply | Qty: 60 | Fill #0

## 2017-12-12 MED FILL — LYRICA 150 MG CAPSULE: 150 | 30 days supply | Qty: 60 | Fill #2

## 2017-12-16 MED FILL — ALENDRONATE NA 70 MG TAB: 70 | 28 days supply | Qty: 4 | Fill #2

## 2017-12-17 MED FILL — METHOCARBAMOL 750 MG TABS: 750 | 30 days supply | Qty: 90 | Fill #0

## 2017-12-17 MED FILL — LORazepam 0.5 MG TABS: 0.5 | 10 days supply | Qty: 30 | Fill #0

## 2017-12-17 MED FILL — ZOLPIDEM TARTRATE 10 MG TAB: 10 | 30 days supply | Qty: 30 | Fill #0

## 2017-12-18 MED FILL — CHLORTHALIDONE 25 MG TAB: 25 | 30 days supply | Qty: 30 | Fill #1

## 2018-01-16 MED FILL — METHOCARBAMOL 750 MG TABS: 750 | 30 days supply | Qty: 90 | Fill #0

## 2018-01-16 MED FILL — CHLORTHALIDONE 25 MG TAB: 25 | 30 days supply | Qty: 30 | Fill #2

## 2018-01-16 MED FILL — LYRICA 150 MG CAPSULE: 150 | 30 days supply | Qty: 60 | Fill #3

## 2018-01-16 MED FILL — LORazepam 0.5 MG TABS: 0.5 | 10 days supply | Qty: 30 | Fill #0

## 2018-01-23 MED FILL — HYDROCODON-APAP 5-325: 5-325 | 15 days supply | Qty: 60 | Fill #0

## 2018-01-23 MED FILL — OMEPRAZOLE 40 MG CPDR: 40 | 90 days supply | Qty: 90 | Fill #0

## 2018-01-29 ENCOUNTER — Other Ambulatory Visit (HOSPITAL_COMMUNITY)
Admission: RE | Admit: 2018-01-29 | Discharge: 2018-01-29 | Disposition: A | Discharge status: Home / Self Care | Payer: 59 | Source: Ambulatory Visit | Attending: Family Medicine | Admitting: Family Medicine

## 2018-01-29 DIAGNOSIS — M810 Age-related osteoporosis without current pathological fracture: Secondary | ICD-10-CM | POA: Insufficient documentation

## 2018-01-29 LAB — TSH: TSH: 0.74 u[IU]/mL (ref 0.350–4.500)

## 2018-01-29 LAB — COMPREHENSIVE METABOLIC PANEL
ALBUMIN: 4.5 g/dL (ref 3.5–5.0)
ALT: 41 U/L (ref 14–54)
AST: 35 U/L (ref 15–41)
Alkaline Phosphatase: 60 U/L (ref 38–126)
Anion gap: 8 (ref 5–15)
BUN: 14 mg/dL (ref 6–20)
CHLORIDE: 103 mmol/L (ref 101–111)
CO2: 29 mmol/L (ref 22–32)
Calcium: 9.4 mg/dL (ref 8.9–10.3)
Creatinine, Ser: 0.67 mg/dL (ref 0.44–1.00)
GFR calc Af Amer: >60 mL/min (ref >60)
GLUCOSE: 102 mg/dL — AB (ref 65–99)
Potassium: 4.1 mmol/L (ref 3.5–5.1)
Sodium: 140 mmol/L (ref 135–145)
Total Bilirubin: 1 mg/dL (ref 0.3–1.2)
Total Protein: 7.2 g/dL (ref 6.5–8.1)

## 2018-01-29 LAB — LIPID PANEL
CHOL/HDL RATIO: 2 ratio
CHOLESTEROL: 181 mg/dL (ref 0–200)
HDL: 92 mg/dL (ref >40)
LDL CALC: 77 mg/dL (ref 0–99)
Triglycerides: 61 mg/dL (ref <150)
VLDL: 12 mg/dL (ref 0–40)

## 2018-01-29 LAB — CBC
HCT: 43.2 % (ref 36.0–46.0)
HEMOGLOBIN: 14.8 g/dL (ref 12.0–15.0)
MCH: 31.8 pg (ref 26.0–34.0)
MCHC: 34.3 g/dL (ref 30.0–36.0)
MCV: 92.7 fL (ref 78.0–100.0)
Platelets: 242 10*3/uL (ref 150–400)
RBC: 4.66 MIL/uL (ref 3.87–5.11)
RDW: 12.3 % (ref 11.5–15.5)
WBC: 4.4 10*3/uL (ref 4.0–10.5)

## 2018-01-30 DIAGNOSIS — Z6828 Body mass index (BMI) 28.0-28.9, adult: Secondary | ICD-10-CM | POA: Diagnosis not present

## 2018-01-30 DIAGNOSIS — Z0000 Encounter for general adult medical examination without abnormal findings: Secondary | ICD-10-CM | POA: Diagnosis not present

## 2018-01-30 DIAGNOSIS — E559 Vitamin D deficiency, unspecified: Secondary | ICD-10-CM | POA: Diagnosis not present

## 2018-01-30 DIAGNOSIS — G500 Trigeminal neuralgia: Secondary | ICD-10-CM | POA: Diagnosis not present

## 2018-01-30 DIAGNOSIS — M810 Age-related osteoporosis without current pathological fracture: Secondary | ICD-10-CM | POA: Diagnosis not present

## 2018-01-30 DIAGNOSIS — I10 Essential (primary) hypertension: Secondary | ICD-10-CM | POA: Diagnosis not present

## 2018-01-30 DIAGNOSIS — K219 Gastro-esophageal reflux disease without esophagitis: Secondary | ICD-10-CM | POA: Diagnosis not present

## 2018-01-30 LAB — VITAMIN D 25 HYDROXY (VIT D DEFICIENCY, FRACTURES): Vit D, 25-Hydroxy: 32.6 ng/mL (ref 30.0–100.0)

## 2018-02-10 MED FILL — ALENDRONATE NA 70 MG TAB: 70 | 28 days supply | Qty: 4 | Fill #3

## 2018-02-10 MED FILL — LORazepam 0.5 MG TABS: 0.5 | 10 days supply | Qty: 30 | Fill #0

## 2018-02-10 MED FILL — METHOCARBAMOL 750 MG TABS: 750 | 30 days supply | Qty: 90 | Fill #0

## 2018-02-24 MED FILL — ZOLPIDEM TARTRATE 10 MG TAB: 10 | 30 days supply | Qty: 30 | Fill #0

## 2018-02-24 MED FILL — LYRICA 150 MG CAPSULE: 150 | 30 days supply | Qty: 60 | Fill #4

## 2018-02-24 MED FILL — SUMATRIPTAN SUCC 100 MG TAB: 100 | 30 days supply | Qty: 8 | Fill #0

## 2018-02-24 MED FILL — CHLORTHALIDONE 25 MG TAB: 25 | 30 days supply | Qty: 30 | Fill #3

## 2018-02-24 MED FILL — LORazepam 0.5 MG TABS: 0.5 | 10 days supply | Qty: 30 | Fill #0

## 2018-02-24 MED FILL — LOSARTAN POTASSIUM 100 MG T: 100 | 90 days supply | Qty: 90 | Fill #0

## 2018-03-10 MED FILL — HYDROCODON-APAP 5-325: 5-325 | 15 days supply | Qty: 60 | Fill #0

## 2018-03-11 MED FILL — METHOCARBAMOL 750 MG TABS: 750 | 30 days supply | Qty: 90 | Fill #0

## 2018-03-26 MED FILL — LORazepam 0.5 MG TABS: 0.5 | 10 days supply | Qty: 30 | Fill #0

## 2018-03-26 MED FILL — LYRICA 150 MG CAPSULE: 150 | 30 days supply | Qty: 60 | Fill #5

## 2018-03-26 MED FILL — CHLORTHALIDONE 25 MG TABS: 25 | 30 days supply | Qty: 30 | Fill #4

## 2018-04-07 MED FILL — METHOCARBAMOL 750 MG TABS: 750 | 30 days supply | Qty: 90 | Fill #0

## 2018-04-07 MED FILL — ZOLPIDEM TARTRATE 10 MG TAB: 10 | 30 days supply | Qty: 30 | Fill #0

## 2018-04-07 MED FILL — LORazepam 0.5 MG TABS: 0.5 | 10 days supply | Qty: 30 | Fill #0

## 2018-04-07 MED FILL — ALENDRONATE NA 70 MG TAB: 70 | 28 days supply | Qty: 4 | Fill #4

## 2018-04-10 MED FILL — SCOPOLAMINE 1 MG/3DAYS PT72: 1 | 12 days supply | Qty: 4 | Fill #0

## 2018-04-14 MED FILL — HYDROCODON-APAP 5-325: 5-325 | 15 days supply | Qty: 60 | Fill #0

## 2018-04-25 MED FILL — CHLORTHALIDONE 25 MG TABS: 25 | 90 days supply | Qty: 90 | Fill #0

## 2018-04-25 MED FILL — OMEPRAZOLE 40 MG CPDR: 40 | 90 days supply | Qty: 90 | Fill #0

## 2018-04-25 MED FILL — SUMAtriptan SUCCINATE 100 M: 100 | 30 days supply | Qty: 8 | Fill #1

## 2018-05-02 MED FILL — METHOCARBAMOL 750 MG TABS: 750 | 30 days supply | Qty: 90 | Fill #0

## 2018-05-02 MED FILL — LORazepam 0.5 MG TABS: 0.5 | 10 days supply | Qty: 30 | Fill #0

## 2018-05-20 MED FILL — PHENTERMINE 37.5 MG TABLET: 37.5 | 30 days supply | Qty: 30 | Fill #0

## 2018-05-27 MED FILL — PREGABALIN 150 MG CAPS: 150 | 30 days supply | Qty: 60 | Fill #0

## 2018-05-28 MED FILL — LORazepam 0.5 MG TABS: 0.5 | 10 days supply | Qty: 30 | Fill #0

## 2018-05-30 MED FILL — METHOCARBAMOL 750 MG TABS: 750 | 30 days supply | Qty: 90 | Fill #1

## 2018-06-02 MED FILL — HYDROCODON-APAP 5-325: 5-325 | 15 days supply | Qty: 60 | Fill #0

## 2018-06-30 MED FILL — METHOCARBAMOL 750 MG TABS: 750 | 30 days supply | Qty: 90 | Fill #2

## 2018-06-30 MED FILL — SUMAtriptan SUCCINATE 100 M: 100 | 30 days supply | Qty: 8 | Fill #2

## 2018-06-30 MED FILL — LORazepam 0.5 MG TABS: 0.5 | 10 days supply | Qty: 30 | Fill #1

## 2018-06-30 MED FILL — PREGABALIN 150 MG CAPS: 150 | 30 days supply | Qty: 60 | Fill #1

## 2018-07-18 MED FILL — HYDROCODON-APAP 5-325: 5-325 | 15 days supply | Qty: 60 | Fill #0

## 2018-07-21 MED FILL — LOSARTAN POTASSIUM 100 MG T: 100 | 30 days supply | Qty: 30 | Fill #1

## 2018-07-21 MED FILL — CHLORTHALIDONE 25 MG TABS: 25 | 90 days supply | Qty: 90 | Fill #0

## 2018-07-21 MED FILL — LORazepam 0.5 MG TABS: 0.5 | 10 days supply | Qty: 30 | Fill #0

## 2018-07-21 MED FILL — OMEPRAZOLE 40 MG CPDR: 40 | 90 days supply | Qty: 90 | Fill #0

## 2018-07-25 MED FILL — METHOCARBAMOL 750 MG TABS: 750 | 30 days supply | Qty: 90 | Fill #3

## 2018-07-28 MED FILL — PREGABALIN 150 MG CAPS: 150 | 30 days supply | Qty: 60 | Fill #0

## 2018-07-30 DIAGNOSIS — J209 Acute bronchitis, unspecified: Secondary | ICD-10-CM | POA: Diagnosis not present

## 2018-07-30 MED FILL — BENZONATATE 100 MG CAP: 100 | 10 days supply | Qty: 30 | Fill #0

## 2018-07-31 DIAGNOSIS — H5213 Myopia, bilateral: Secondary | ICD-10-CM | POA: Diagnosis not present

## 2018-08-04 MED FILL — METHOCARBAMOL 750 MG TABS: 750 | 30 days supply | Qty: 90 | Fill #3

## 2018-08-04 MED FILL — SUMATRIPTAN SUCC 100 MG TAB: 100 | 30 days supply | Qty: 9 | Fill #3

## 2018-09-01 MED FILL — METHOCARBAMOL 750 MG TABS: 750 | 30 days supply | Qty: 90 | Fill #4

## 2018-09-01 MED FILL — OSELTAMIVIR PHOSPHATE 75 MG: 75 | 5 days supply | Qty: 10 | Fill #0

## 2018-09-15 MED FILL — SUMAtriptan SUCCINATE 100 M: 100 | 30 days supply | Qty: 9 | Fill #4

## 2018-09-26 MED FILL — METHOCARBAMOL 750 MG TABS: 750 | 30 days supply | Qty: 90 | Fill #5

## 2018-09-26 MED FILL — LOSARTAN POTASSIUM 100 MG T: 100 | 30 days supply | Qty: 30 | Fill #3

## 2018-09-26 MED FILL — ZOLPIDEM TARTRATE 10 MG TAB: 10 | 90 days supply | Qty: 90 | Fill #0

## 2018-09-30 MED FILL — BENZONATATE 100 MG CAP: 100 | 10 days supply | Qty: 30 | Fill #1

## 2018-09-30 MED FILL — PREGABALIN 150 MG CAPS: 150 | 30 days supply | Qty: 60 | Fill #0

## 2018-09-30 MED FILL — HYDROCODON-APAP 5-325: 5-325 | 15 days supply | Qty: 60 | Fill #0

## 2018-10-01 MED FILL — LORazepam 0.5 MG TABS: 0.5 | 10 days supply | Qty: 30 | Fill #0

## 2018-10-23 MED FILL — LOSARTAN POTASSIUM 100 MG T: 100 | 30 days supply | Qty: 30 | Fill #4

## 2018-10-23 MED FILL — OMEPRAZOLE 40 MG CPDR: 40 | 90 days supply | Qty: 90 | Fill #0

## 2018-10-23 MED FILL — METHOCARBAMOL 750 MG TABS: 750 | 30 days supply | Qty: 90 | Fill #0

## 2018-11-03 MED FILL — SUMAtriptan SUCCINATE 100 M: 100 | 30 days supply | Qty: 9 | Fill #5

## 2018-11-03 MED FILL — HYDROCODON-APAP 5-325: 5-325 | 15 days supply | Qty: 60 | Fill #0

## 2018-11-03 MED FILL — PREGABALIN 150 MG CAPS: 150 | 30 days supply | Qty: 60 | Fill #1

## 2018-11-04 MED FILL — LORazepam 0.5 MG TABS: 0.5 | 10 days supply | Qty: 30 | Fill #0

## 2018-11-11 MED FILL — CHLORTHALIDONE 25 MG TABS: 25 | 90 days supply | Qty: 90 | Fill #0

## 2018-11-11 MED FILL — METHOCARBAMOL 750 MG TABS: 750 | 30 days supply | Qty: 90 | Fill #1

## 2018-11-12 MED FILL — LOSARTAN POTASSIUM 100 MG T: 100 | 90 days supply | Qty: 90 | Fill #0

## 2018-12-01 MED FILL — PREGABALIN 150 MG CAPS: 150 | 30 days supply | Qty: 60 | Fill #2

## 2018-12-01 MED FILL — SUMATRIPTAN SUCC 100 MG TAB: 100 | 30 days supply | Qty: 9 | Fill #6

## 2018-12-09 MED FILL — METHOCARBAMOL 750 MG TABS: 750 | 30 days supply | Qty: 90 | Fill #2

## 2018-12-15 MED FILL — HYDROCODON-APAP 5-325: 5-325 | 15 days supply | Qty: 60 | Fill #0

## 2018-12-18 DIAGNOSIS — K219 Gastro-esophageal reflux disease without esophagitis: Secondary | ICD-10-CM | POA: Diagnosis not present

## 2018-12-18 DIAGNOSIS — I10 Essential (primary) hypertension: Secondary | ICD-10-CM | POA: Diagnosis not present

## 2018-12-18 DIAGNOSIS — G43909 Migraine, unspecified, not intractable, without status migrainosus: Secondary | ICD-10-CM | POA: Diagnosis not present

## 2018-12-18 DIAGNOSIS — E559 Vitamin D deficiency, unspecified: Secondary | ICD-10-CM | POA: Diagnosis not present

## 2018-12-18 DIAGNOSIS — D638 Anemia in other chronic diseases classified elsewhere: Secondary | ICD-10-CM | POA: Diagnosis not present

## 2018-12-18 DIAGNOSIS — M810 Age-related osteoporosis without current pathological fracture: Secondary | ICD-10-CM | POA: Diagnosis not present

## 2018-12-18 DIAGNOSIS — R51 Headache: Secondary | ICD-10-CM | POA: Diagnosis not present

## 2018-12-18 MED FILL — LORazepam 0.5 MG TABS: 0.5 | 20 days supply | Qty: 60 | Fill #0

## 2018-12-25 MED FILL — PREGABALIN 150 MG CAPS: 150 | 30 days supply | Qty: 60 | Fill #3

## 2018-12-26 ENCOUNTER — Other Ambulatory Visit (HOSPITAL_COMMUNITY)
Admission: RE | Admit: 2018-12-26 | Discharge: 2018-12-26 | Disposition: A | Discharge status: Home / Self Care | Payer: 59 | Source: Ambulatory Visit | Attending: Family Medicine | Admitting: Family Medicine

## 2018-12-26 DIAGNOSIS — Z0000 Encounter for general adult medical examination without abnormal findings: Secondary | ICD-10-CM | POA: Diagnosis not present

## 2018-12-26 LAB — COMPREHENSIVE METABOLIC PANEL
ALT: 31 U/L (ref 0–44)
AST: 28 U/L (ref 15–41)
Albumin: 4.2 g/dL (ref 3.5–5.0)
Alkaline Phosphatase: 55 U/L (ref 38–126)
Anion gap: 9 (ref 5–15)
BUN: 27 mg/dL — ABNORMAL HIGH (ref 6–20)
CO2: 27 mmol/L (ref 22–32)
Calcium: 9.3 mg/dL (ref 8.9–10.3)
Chloride: 101 mmol/L (ref 98–111)
Creatinine, Ser: 0.86 mg/dL (ref 0.44–1.00)
GFR calc Af Amer: >60 mL/min (ref >60)
GFR calc non Af Amer: >60 mL/min (ref >60)
Glucose, Bld: 90 mg/dL (ref 70–99)
Potassium: 3.8 mmol/L (ref 3.5–5.1)
Sodium: 137 mmol/L (ref 135–145)
Total Bilirubin: 1.1 mg/dL (ref 0.3–1.2)
Total Protein: 6.9 g/dL (ref 6.5–8.1)

## 2018-12-26 LAB — CBC
HCT: 39.7 % (ref 36.0–46.0)
Hemoglobin: 12.9 g/dL (ref 12.0–15.0)
MCH: 31.2 pg (ref 26.0–34.0)
MCHC: 32.5 g/dL (ref 30.0–36.0)
MCV: 95.9 fL (ref 80.0–100.0)
Platelets: 221 10*3/uL (ref 150–400)
RBC: 4.14 MIL/uL (ref 3.87–5.11)
RDW: 12.9 % (ref 11.5–15.5)
WBC: 6.9 10*3/uL (ref 4.0–10.5)
nRBC: 0 % (ref 0.0–0.2)

## 2019-01-05 MED FILL — METHOCARBAMOL 750 MG TABS: 750 | 30 days supply | Qty: 90 | Fill #3

## 2019-01-14 MED FILL — OMEPRAZOLE 40 MG CPDR: 40 | 90 days supply | Qty: 90 | Fill #0

## 2019-01-15 MED FILL — LORazepam 0.5 MG TABS: 0.5 | 20 days supply | Qty: 60 | Fill #1

## 2019-01-15 MED FILL — HYDROCODON-APAP 5-325: 5-325 | 15 days supply | Qty: 60 | Fill #0

## 2019-01-29 MED FILL — PREGABALIN 150 MG CAPS: 150 | 30 days supply | Qty: 60 | Fill #4

## 2019-02-06 MED FILL — METHOCARBAMOL 750 MG TABS: 750 | 30 days supply | Qty: 90 | Fill #4

## 2019-02-06 MED FILL — SUMATRIPTAN SUCC 100 MG TAB: 100 | 30 days supply | Qty: 9 | Fill #7

## 2019-02-18 MED FILL — HYDROCODON-APAP 5-325: 5-325 | 15 days supply | Qty: 60 | Fill #0

## 2019-02-18 MED FILL — LORazepam 0.5 MG TABS: 0.5 | 30 days supply | Qty: 90 | Fill #0

## 2019-03-02 MED FILL — ZOLPIDEM TARTRATE 10 MG TAB: 10 | 90 days supply | Qty: 90 | Fill #1

## 2019-03-02 MED FILL — PREGABALIN 150 MG CAPS: 150 | 30 days supply | Qty: 60 | Fill #5

## 2019-03-02 MED FILL — CHLORTHALIDONE 25 MG TABLET: 25 | 90 days supply | Qty: 90 | Fill #1

## 2019-03-02 MED FILL — LOSARTAN POTASSIUM 100 MG T: 100 | 90 days supply | Qty: 90 | Fill #1

## 2019-03-13 MED FILL — METHOCARBAMOL 750 MG TABS: 750 | 30 days supply | Qty: 90 | Fill #5

## 2019-03-16 MED FILL — SUMATRIPTAN SUCC 100 MG TAB: 100 | 90 days supply | Qty: 27 | Fill #0

## 2019-03-18 MED FILL — LORazepam 0.5 MG TABS: 0.5 | 30 days supply | Qty: 90 | Fill #1

## 2019-03-20 MED FILL — HYDROCODON-APAP 5-325: 5-325 | 15 days supply | Qty: 60 | Fill #0

## 2019-03-31 MED FILL — PREGABALIN 150 MG CAPS: 150 | 30 days supply | Qty: 60 | Fill #0

## 2019-04-10 MED FILL — METHOCARBAMOL 750 MG TABS: 750 | 30 days supply | Qty: 90 | Fill #0

## 2019-04-20 MED FILL — HYDROCODON-APAP 5-325: 5-325 | 15 days supply | Qty: 60 | Fill #0

## 2019-04-20 MED FILL — OMEPRAZOLE 40 MG CPDR: 40 | 90 days supply | Qty: 90 | Fill #1

## 2019-04-20 MED FILL — LORazepam 0.5 MG TABS: 0.5 | 30 days supply | Qty: 90 | Fill #0

## 2019-05-12 MED FILL — METHOCARBAMOL 750 MG TABS: 750 | 30 days supply | Qty: 90 | Fill #1

## 2019-05-18 MED FILL — LORazepam 0.5 MG TABS: 0.5 | 30 days supply | Qty: 90 | Fill #1

## 2019-05-18 MED FILL — PREGABALIN 150 MG CAPS: 150 | 30 days supply | Qty: 60 | Fill #1

## 2019-05-20 MED FILL — HYDROCODON-APAP 5-325: 5-325 | 15 days supply | Qty: 60 | Fill #0

## 2019-06-03 MED FILL — LOSARTAN POTASSIUM 100 MG T: 100 | 30 days supply | Qty: 30 | Fill #2

## 2019-06-03 MED FILL — CHLORTHALIDONE 25 MG TABS: 25 | 90 days supply | Qty: 90 | Fill #0

## 2019-06-11 MED FILL — METHOCARBAMOL 750 MG TABS: 750 | 30 days supply | Qty: 90 | Fill #2

## 2019-06-16 MED FILL — SUMATRIPTAN SUCC 100 MG TAB: 100 | 90 days supply | Qty: 27 | Fill #1

## 2019-06-16 MED FILL — PREGABALIN 150 MG CAPS: 150 | 30 days supply | Qty: 60 | Fill #2

## 2019-06-17 MED FILL — LORazepam 0.5 MG TABS: 0.5 | 30 days supply | Qty: 90 | Fill #0

## 2019-06-19 MED FILL — HYDROCODON-APAP 5-325: 5-325 | 15 days supply | Qty: 60 | Fill #0

## 2019-07-08 MED FILL — LOSARTAN POTASSIUM 100 MG T: 100 | 90 days supply | Qty: 90 | Fill #3

## 2019-07-08 MED FILL — ZOLPIDEM TARTRATE 10 MG TAB: 10 | 90 days supply | Qty: 90 | Fill #0

## 2019-07-08 MED FILL — METHOCARBAMOL 750 MG TABS: 750 | 30 days supply | Qty: 90 | Fill #0

## 2019-07-13 MED FILL — PREGABALIN 150 MG CAPS: 150 | 30 days supply | Qty: 60 | Fill #3

## 2019-07-16 MED FILL — LORazepam 0.5 MG TABS: 0.5 | 30 days supply | Qty: 90 | Fill #1

## 2019-07-20 MED FILL — HYDROCODON-APAP 5-325: 5-325 | 15 days supply | Qty: 60 | Fill #0

## 2019-07-29 MED FILL — OMEPRAZOLE 40 MG CPDR: 40 | 90 days supply | Qty: 90 | Fill #2

## 2019-08-06 MED FILL — METHOCARBAMOL 750 MG TABS: 750 | 30 days supply | Qty: 90 | Fill #1

## 2019-08-13 MED FILL — LORazepam 0.5 MG TABS: 0.5 | 30 days supply | Qty: 90 | Fill #0

## 2019-08-13 MED FILL — PREGABALIN 150 MG CAPS: 150 | 30 days supply | Qty: 60 | Fill #4

## 2019-08-18 MED FILL — HYDROCODON-APAP 5-325: 5-325 | 15 days supply | Qty: 60 | Fill #0

## 2019-08-31 MED FILL — CHLORTHALIDONE 25 MG TABS: 25 | 90 days supply | Qty: 90 | Fill #1

## 2019-09-04 MED FILL — METHOCARBAMOL 750 MG TABS: 750 | 30 days supply | Qty: 90 | Fill #0

## 2019-09-11 MED FILL — PREGABALIN 150 MG CAPS: 150 | 30 days supply | Qty: 60 | Fill #5

## 2019-09-11 MED FILL — LORazepam 0.5 MG TABS: 0.5 | 30 days supply | Qty: 90 | Fill #1

## 2019-09-17 MED FILL — HYDROCODON-APAP 5-325: 5-325 | 15 days supply | Qty: 60 | Fill #0

## 2019-09-28 MED FILL — SUMATRIPTAN SUCC 100 MG TAB: 100 | 90 days supply | Qty: 27 | Fill #2

## 2019-10-05 MED FILL — METHOCARBAMOL 750 MG TABS: 750 | 30 days supply | Qty: 90 | Fill #1

## 2019-10-09 ENCOUNTER — Other Ambulatory Visit (HOSPITAL_COMMUNITY): Payer: Self-pay | Admitting: Family Medicine

## 2019-10-09 MED FILL — ZOLPIDEM TARTRATE 10 MG TAB: 10 | 90 days supply | Qty: 90 | Fill #1

## 2019-10-09 MED FILL — LOSARTAN POTASSIUM 100 MG T: 100 | 60 days supply | Qty: 60 | Fill #4

## 2019-10-12 MED FILL — LORazepam 0.5 MG TABS: 0.5 | 30 days supply | Qty: 90 | Fill #0

## 2019-10-13 MED FILL — PREGABALIN 150 MG CAPS: 150 | 30 days supply | Qty: 60 | Fill #0

## 2019-10-19 MED FILL — HYDROCODON-APAP 5-325: 5-325 | 15 days supply | Qty: 60 | Fill #0

## 2019-10-26 MED FILL — OMEPRAZOLE 40 MG CPDR: 40 | 90 days supply | Qty: 90 | Fill #3

## 2019-10-30 MED FILL — METHOCARBAMOL 750 MG TABS: 750 | 30 days supply | Qty: 90 | Fill #0

## 2019-11-09 MED FILL — LORazepam 0.5 MG TABS: 0.5 | 30 days supply | Qty: 90 | Fill #0

## 2019-11-09 MED FILL — PREGABALIN 150 MG CAPS: 150 | 30 days supply | Qty: 60 | Fill #1

## 2019-11-16 MED FILL — HYDROCODON-APAP 5-325: 5-325 | 15 days supply | Qty: 60 | Fill #0

## 2019-11-24 MED FILL — PHENTERMINE 37.5 MG TABLET: 37.5 | 30 days supply | Qty: 30 | Fill #0

## 2019-11-29 MED FILL — CHLORTHALIDONE 25 MG TABS: 25 | 90 days supply | Qty: 90 | Fill #2

## 2019-11-29 MED FILL — METHOCARBAMOL 750 MG TABS: 750 | 30 days supply | Qty: 90 | Fill #1

## 2019-12-08 MED FILL — LOSARTAN POTASSIUM 100 MG T: 100 | 90 days supply | Qty: 90 | Fill #0

## 2019-12-09 MED FILL — LORazepam 0.5 MG TABS: 0.5 | 30 days supply | Qty: 90 | Fill #0

## 2019-12-14 MED FILL — PREGABALIN 150 MG CAPS: 150 | 30 days supply | Qty: 60 | Fill #2

## 2019-12-16 ENCOUNTER — Other Ambulatory Visit (HOSPITAL_COMMUNITY)
Admission: RE | Admit: 2019-12-16 | Discharge: 2019-12-16 | Disposition: A | Discharge status: Home / Self Care | Payer: 59 | Source: Ambulatory Visit | Attending: Family Medicine | Admitting: Family Medicine

## 2019-12-16 DIAGNOSIS — K219 Gastro-esophageal reflux disease without esophagitis: Secondary | ICD-10-CM | POA: Insufficient documentation

## 2019-12-16 DIAGNOSIS — Z0000 Encounter for general adult medical examination without abnormal findings: Secondary | ICD-10-CM | POA: Insufficient documentation

## 2019-12-16 DIAGNOSIS — D649 Anemia, unspecified: Secondary | ICD-10-CM | POA: Insufficient documentation

## 2019-12-16 DIAGNOSIS — I10 Essential (primary) hypertension: Secondary | ICD-10-CM | POA: Insufficient documentation

## 2019-12-16 LAB — LIPID PANEL
Cholesterol: 146 mg/dL (ref 0–200)
HDL: 59 mg/dL (ref >40)
LDL Cholesterol: 71 mg/dL (ref 0–99)
Total CHOL/HDL Ratio: 2.5 RATIO
Triglycerides: 82 mg/dL (ref <150)
VLDL: 16 mg/dL (ref 0–40)

## 2019-12-16 LAB — COMPREHENSIVE METABOLIC PANEL
ALT: 32 U/L (ref 0–44)
AST: 27 U/L (ref 15–41)
Albumin: 3.8 g/dL (ref 3.5–5.0)
Alkaline Phosphatase: 68 U/L (ref 38–126)
Anion gap: 9 (ref 5–15)
BUN: 28 mg/dL — ABNORMAL HIGH (ref 6–20)
CO2: 24 mmol/L (ref 22–32)
Calcium: 9.4 mg/dL (ref 8.9–10.3)
Chloride: 106 mmol/L (ref 98–111)
Creatinine, Ser: 0.83 mg/dL (ref 0.44–1.00)
GFR calc Af Amer: >60 mL/min (ref >60)
GFR calc non Af Amer: >60 mL/min (ref >60)
Glucose, Bld: 109 mg/dL — ABNORMAL HIGH (ref 70–99)
Potassium: 4.3 mmol/L (ref 3.5–5.1)
Sodium: 139 mmol/L (ref 135–145)
Total Bilirubin: 0.7 mg/dL (ref 0.3–1.2)
Total Protein: 6.5 g/dL (ref 6.5–8.1)

## 2019-12-16 LAB — CBC
HCT: 40.1 % (ref 36.0–46.0)
Hemoglobin: 13.3 g/dL (ref 12.0–15.0)
MCH: 30.8 pg (ref 26.0–34.0)
MCHC: 33.2 g/dL (ref 30.0–36.0)
MCV: 92.8 fL (ref 80.0–100.0)
Platelets: 276 10*3/uL (ref 150–400)
RBC: 4.32 MIL/uL (ref 3.87–5.11)
RDW: 12 % (ref 11.5–15.5)
WBC: 6.9 10*3/uL (ref 4.0–10.5)
nRBC: 0 % (ref 0.0–0.2)

## 2019-12-16 LAB — TSH: TSH: 0.668 u[IU]/mL (ref 0.350–4.500)

## 2019-12-17 ENCOUNTER — Other Ambulatory Visit (HOSPITAL_COMMUNITY): Payer: Self-pay | Admitting: Family Medicine

## 2019-12-17 DIAGNOSIS — M810 Age-related osteoporosis without current pathological fracture: Secondary | ICD-10-CM | POA: Diagnosis not present

## 2019-12-17 DIAGNOSIS — G500 Trigeminal neuralgia: Secondary | ICD-10-CM | POA: Diagnosis not present

## 2019-12-17 DIAGNOSIS — K219 Gastro-esophageal reflux disease without esophagitis: Secondary | ICD-10-CM | POA: Diagnosis not present

## 2019-12-17 DIAGNOSIS — E559 Vitamin D deficiency, unspecified: Secondary | ICD-10-CM | POA: Diagnosis not present

## 2019-12-17 DIAGNOSIS — D638 Anemia in other chronic diseases classified elsewhere: Secondary | ICD-10-CM | POA: Diagnosis not present

## 2019-12-17 DIAGNOSIS — R519 Headache, unspecified: Secondary | ICD-10-CM | POA: Diagnosis not present

## 2019-12-17 DIAGNOSIS — I10 Essential (primary) hypertension: Secondary | ICD-10-CM | POA: Diagnosis not present

## 2019-12-17 MED FILL — HYDROCODON-APAP 5-325: 5-325 | 15 days supply | Qty: 60 | Fill #0

## 2019-12-18 MED FILL — FAMOTIDINE 40 MG TABS: 40 | 90 days supply | Qty: 90 | Fill #0

## 2019-12-28 MED FILL — METHOCARBAMOL 750 MG TABS: 750 | 30 days supply | Qty: 90 | Fill #0

## 2020-01-18 MED FILL — HYDROCODON-APAP 5-325: 5-325 | 15 days supply | Qty: 60 | Fill #0

## 2020-01-19 MED FILL — PREGABALIN 150 MG CAPS: 150 | 30 days supply | Qty: 60 | Fill #3

## 2020-01-19 MED FILL — PHENTERMINE 37.5 MG TABLET: 37.5 | 60 days supply | Qty: 60 | Fill #0

## 2020-01-26 ENCOUNTER — Other Ambulatory Visit (HOSPITAL_COMMUNITY): Payer: Self-pay | Admitting: Family Medicine

## 2020-01-26 MED FILL — METHOCARBAMOL 750 MG TABS: 750 | 30 days supply | Qty: 90 | Fill #1

## 2020-01-27 MED FILL — OMEPRAZOLE 40 MG CPDR: 40 | 90 days supply | Qty: 90 | Fill #0

## 2020-02-09 MED FILL — LORazepam 0.5 MG TABS: 0.5 | 90 days supply | Qty: 90 | Fill #0

## 2020-02-15 MED FILL — ZOLPIDEM TARTRATE 10 MG TAB: 10 | 90 days supply | Qty: 90 | Fill #0

## 2020-02-18 MED FILL — HYDROCODON-APAP 5-325: 5-325 | 15 days supply | Qty: 60 | Fill #0

## 2020-02-24 MED FILL — PREGABALIN 150 MG CAPS: 150 | 30 days supply | Qty: 60 | Fill #4

## 2020-02-25 MED FILL — METHOCARBAMOL 750 MG TABS: 750 | 30 days supply | Qty: 90 | Fill #0

## 2020-03-04 MED FILL — CHLORTHALIDONE 25 MG TABS: 25 | 90 days supply | Qty: 90 | Fill #3

## 2020-03-08 MED FILL — FAMOTIDINE 40 MG TABS: 40 | 90 days supply | Qty: 90 | Fill #1

## 2020-03-08 MED FILL — LOSARTAN POTASSIUM 100 MG T: 100 | 90 days supply | Qty: 90 | Fill #1

## 2020-03-09 MED FILL — LORazepam 0.5 MG TABS: 0.5 | 30 days supply | Qty: 90 | Fill #0

## 2020-03-21 MED FILL — METHOCARBAMOL 750 MG TABS: 750 | 30 days supply | Qty: 90 | Fill #1

## 2020-03-21 MED FILL — HYDROCODON-APAP 5-325: 5-325 | 15 days supply | Qty: 60 | Fill #0

## 2020-03-21 MED FILL — PREGABALIN 150 MG CAPS: 150 | 30 days supply | Qty: 60 | Fill #5

## 2020-04-11 MED FILL — LORazepam 0.5 MG TABS: 0.5 | 30 days supply | Qty: 90 | Fill #0

## 2020-04-20 ENCOUNTER — Other Ambulatory Visit (HOSPITAL_COMMUNITY): Payer: Self-pay | Admitting: Family Medicine

## 2020-04-20 MED FILL — HYDROCODON-APAP 5-325: 5-325 | 15 days supply | Qty: 60 | Fill #0

## 2020-04-20 MED FILL — SUMATRIPTAN SUCC 100 MG TAB: 100 | 90 days supply | Qty: 27 | Fill #0

## 2020-04-22 MED FILL — METHOCARBAMOL 750 MG TABS: 750 | 30 days supply | Qty: 90 | Fill #0

## 2020-04-25 ENCOUNTER — Other Ambulatory Visit (HOSPITAL_COMMUNITY): Payer: Self-pay | Admitting: Family Medicine

## 2020-04-25 MED FILL — PREGABALIN 150 MG CAPS: 150 | 30 days supply | Qty: 60 | Fill #0

## 2020-05-06 MED FILL — OMEPRAZOLE 40 MG CPDR: 40 | 90 days supply | Qty: 90 | Fill #1

## 2020-05-13 MED FILL — LORazepam 0.5 MG TABS: 0.5 | 30 days supply | Qty: 90 | Fill #0

## 2020-05-23 MED FILL — METHOCARBAMOL 750 MG TABS: 750 | 30 days supply | Qty: 90 | Fill #1

## 2020-05-23 MED FILL — HYDROCODON-APAP 5-325: 5-325 | 15 days supply | Qty: 60 | Fill #0

## 2020-06-01 ENCOUNTER — Other Ambulatory Visit (HOSPITAL_COMMUNITY): Payer: Self-pay | Admitting: Family Medicine

## 2020-06-01 MED FILL — ZOLPIDEM TARTRATE 10 MG TAB: 10 | 90 days supply | Qty: 90 | Fill #1

## 2020-06-01 MED FILL — PREGABALIN 150 MG CAPS: 150 | 30 days supply | Qty: 60 | Fill #1

## 2020-06-02 MED FILL — CHLORTHALIDONE 25 MG TABS: 25 | 90 days supply | Qty: 90 | Fill #0

## 2020-06-08 MED FILL — LOSARTAN POTASSIUM 100 MG T: 100 | 90 days supply | Qty: 90 | Fill #2

## 2020-06-08 MED FILL — FAMOTIDINE 40 MG TABS: 40 | 90 days supply | Qty: 90 | Fill #2

## 2020-06-10 DIAGNOSIS — W1849XA Other slipping, tripping and stumbling without falling, initial encounter: Secondary | ICD-10-CM | POA: Diagnosis not present

## 2020-06-10 DIAGNOSIS — M810 Age-related osteoporosis without current pathological fracture: Secondary | ICD-10-CM | POA: Diagnosis not present

## 2020-06-10 DIAGNOSIS — W19XXXA Unspecified fall, initial encounter: Secondary | ICD-10-CM | POA: Diagnosis not present

## 2020-06-10 DIAGNOSIS — M546 Pain in thoracic spine: Secondary | ICD-10-CM | POA: Diagnosis not present

## 2020-06-13 ENCOUNTER — Other Ambulatory Visit (HOSPITAL_COMMUNITY): Payer: Self-pay | Admitting: Family Medicine

## 2020-06-13 MED FILL — CALCITONIN-SALMON 200 UNITS: 200 | 30 days supply | Qty: 4 | Fill #0

## 2020-06-14 MED FILL — LORazepam 0.5 MG TABS: 0.5 | 30 days supply | Qty: 90 | Fill #0

## 2020-06-20 ENCOUNTER — Other Ambulatory Visit (HOSPITAL_COMMUNITY): Payer: Self-pay | Admitting: Neurosurgery

## 2020-06-20 DIAGNOSIS — Z6830 Body mass index (BMI) 30.0-30.9, adult: Secondary | ICD-10-CM | POA: Diagnosis not present

## 2020-06-20 DIAGNOSIS — S22000A Wedge compression fracture of unspecified thoracic vertebra, initial encounter for closed fracture: Secondary | ICD-10-CM | POA: Diagnosis not present

## 2020-06-20 DIAGNOSIS — M546 Pain in thoracic spine: Secondary | ICD-10-CM | POA: Diagnosis not present

## 2020-06-20 MED FILL — HYDROCODON-APAP 5-325: 5-325 | 8 days supply | Qty: 90 | Fill #0

## 2020-06-22 ENCOUNTER — Other Ambulatory Visit (HOSPITAL_COMMUNITY): Payer: Self-pay | Admitting: Family Medicine

## 2020-06-22 MED FILL — METHOCARBAMOL 750 MG TABS: 750 | 30 days supply | Qty: 90 | Fill #0

## 2020-07-06 ENCOUNTER — Other Ambulatory Visit: Payer: Self-pay | Admitting: Neurosurgery

## 2020-07-06 DIAGNOSIS — M810 Age-related osteoporosis without current pathological fracture: Secondary | ICD-10-CM

## 2020-07-07 MED FILL — PREGABALIN 150 MG CAPS: 150 | 30 days supply | Qty: 60 | Fill #2

## 2020-07-14 ENCOUNTER — Other Ambulatory Visit (HOSPITAL_COMMUNITY): Payer: Self-pay | Admitting: Physician Assistant

## 2020-07-14 MED FILL — LORazepam 0.5 MG TABS: 0.5 | 30 days supply | Qty: 90 | Fill #0

## 2020-07-22 MED FILL — CALCITONIN-SALMON 200 UNITS: 200 | 30 days supply | Qty: 4 | Fill #1

## 2020-07-25 ENCOUNTER — Other Ambulatory Visit (HOSPITAL_COMMUNITY): Payer: Self-pay | Admitting: Family Medicine

## 2020-07-25 MED FILL — HYDROCODON-APAP 5-325: 5-325 | 8 days supply | Qty: 90 | Fill #0

## 2020-07-26 MED FILL — METHOCARBAMOL 750 MG TABS: 750 | 30 days supply | Qty: 90 | Fill #0

## 2020-07-27 ENCOUNTER — Other Ambulatory Visit: Payer: Self-pay | Admitting: Neurosurgery

## 2020-07-27 ENCOUNTER — Other Ambulatory Visit (HOSPITAL_COMMUNITY): Payer: Self-pay | Admitting: Neurosurgery

## 2020-07-27 DIAGNOSIS — S22000A Wedge compression fracture of unspecified thoracic vertebra, initial encounter for closed fracture: Secondary | ICD-10-CM

## 2020-08-02 ENCOUNTER — Other Ambulatory Visit: Payer: Self-pay

## 2020-08-02 ENCOUNTER — Ambulatory Visit (HOSPITAL_COMMUNITY)
Admission: RE | Admit: 2020-08-02 | Discharge: 2020-08-02 | Disposition: A | Discharge status: Home / Self Care | Payer: 59 | Source: Ambulatory Visit | Attending: Neurosurgery | Admitting: Neurosurgery

## 2020-08-02 DIAGNOSIS — S22000A Wedge compression fracture of unspecified thoracic vertebra, initial encounter for closed fracture: Secondary | ICD-10-CM | POA: Diagnosis not present

## 2020-08-02 DIAGNOSIS — S22060A Wedge compression fracture of T7-T8 vertebra, initial encounter for closed fracture: Secondary | ICD-10-CM | POA: Diagnosis not present

## 2020-08-02 IMAGING — MR MR THORACIC SPINE W/O CM
7 of 8 series · 37 of 48 positions shown · non-contrast
Comparison: None.

CLINICAL DATA: Fell with twisting injury and back pain.

EXAM:
MRI THORACIC SPINE WITHOUT CONTRAST
TECHNIQUE: Multiplanar, multisequence MR imaging of the thoracic spine was
performed. No intravenous contrast was administered.

[Series 16: T1 · sagittal · 4.0mm · 1.72mm/px · 1 of 5 slices shown (1 of 2)]
[im 1/5]
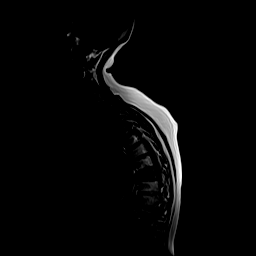

[Series 17: STIR · sagittal · 3.0mm · 1.00mm/px · 5 of 20 slices shown]
[im 1/20]
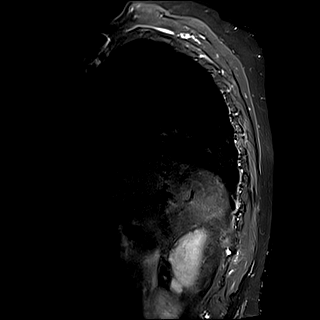
[im 5/20]
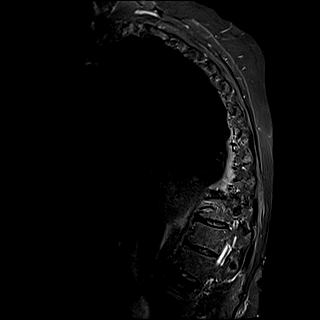
[im 10/20]
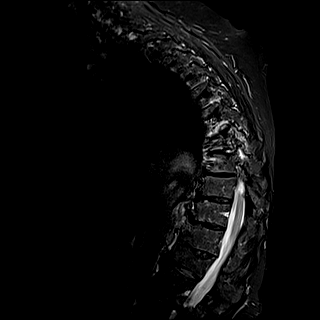
[im 15/20]
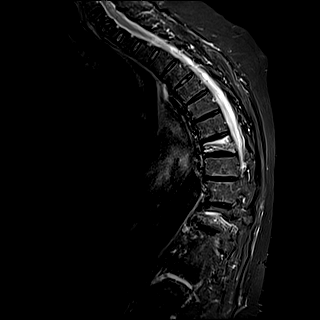
[im 20/20]
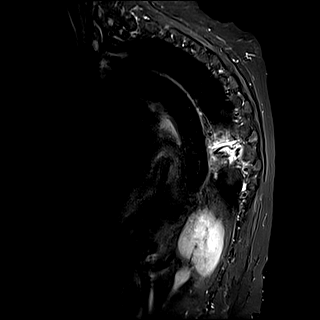

[Series 18: T1 · sagittal · 3.0mm · 1.00mm/px · 4 of 15 slices shown (2 of 2)]
[im 1/15]
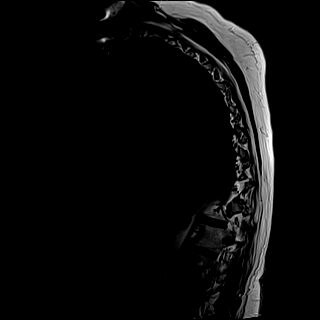
[im 5/15]
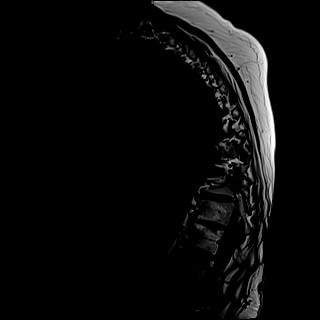
[im 10/15]
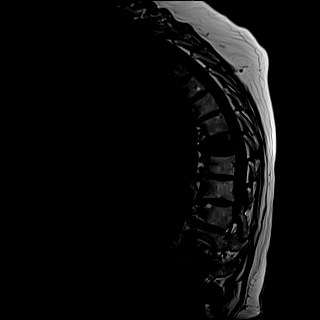
[im 15/15]
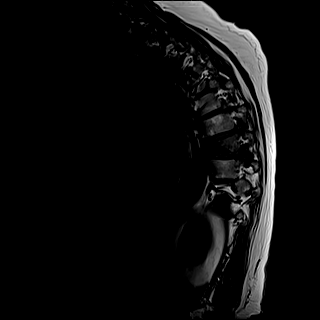

[Series 19: T2 · sagittal · 3.0mm · 0.83mm/px · 5 of 20 slices shown (1 of 3)]
[im 1/20]
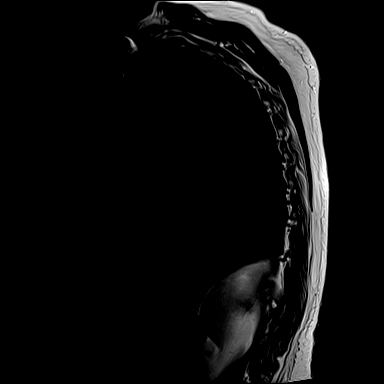
[im 5/20]
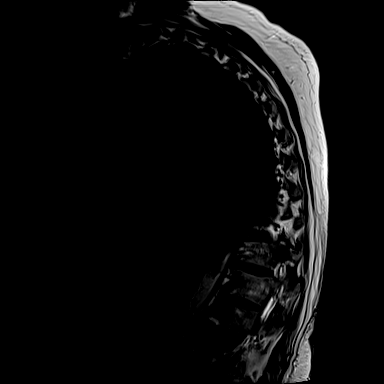
[im 10/20]
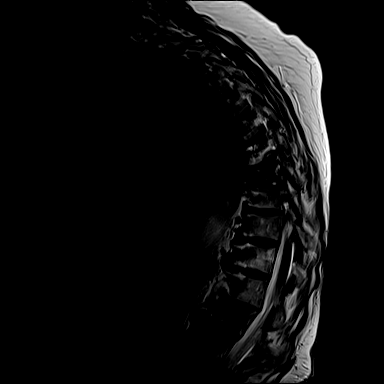
[im 15/20]
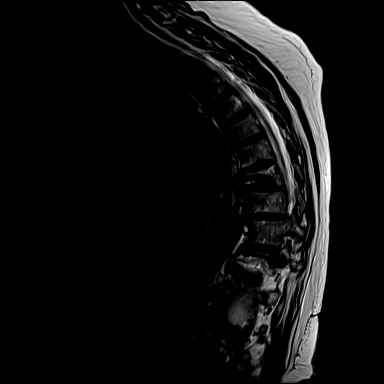
[im 20/20]
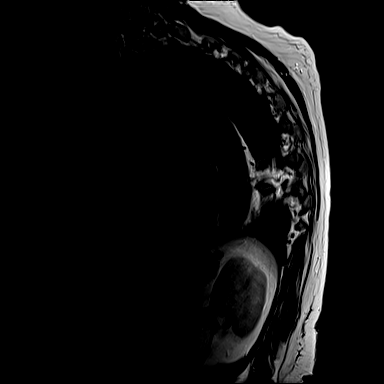

[Series 20: T2 · axial · 4.0mm · 0.78mm/px · z∈[-163,+92]mm · 8 of 39 slices shown (2 of 3)]
[im 1/39]
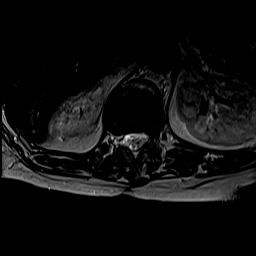
[im 5/39]
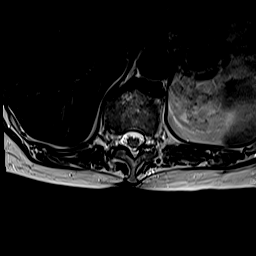
[im 13/39]
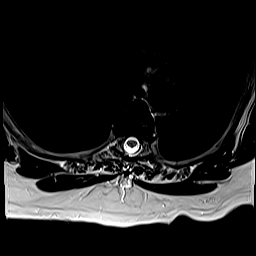
[im 17/39]
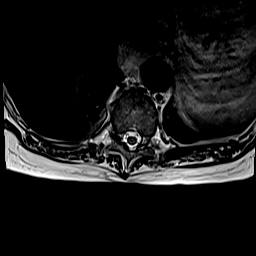
[im 22/39]
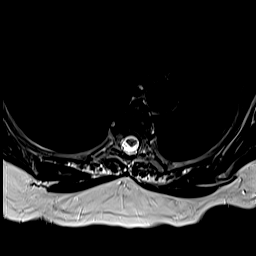
[im 26/39]
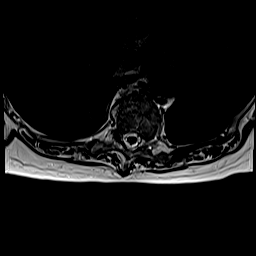
[im 34/39]
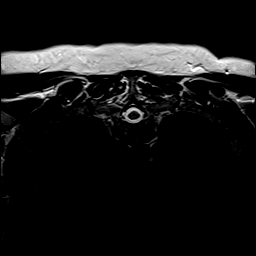
[im 39/39]
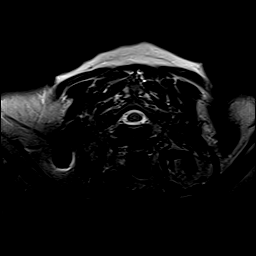

[Series 21: t2_me2d_tra · axial · 4.0mm · 0.39mm/px · z∈[-163,+92]mm · 8 of 42 slices shown]
[im 1/42]
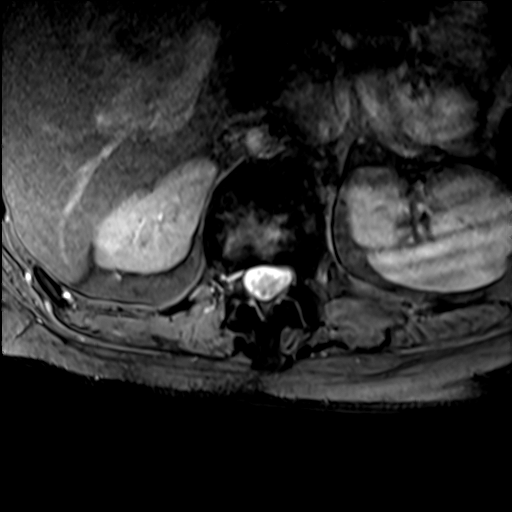
[im 9/42]
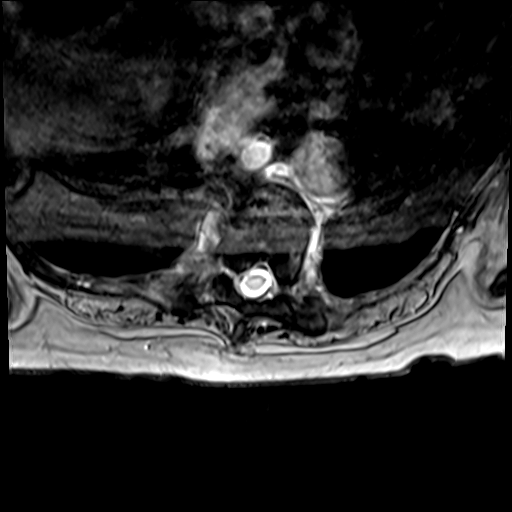
[im 13/42]
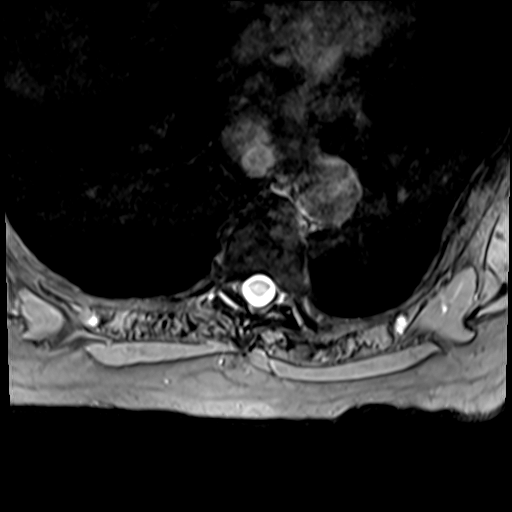
[im 17/42]
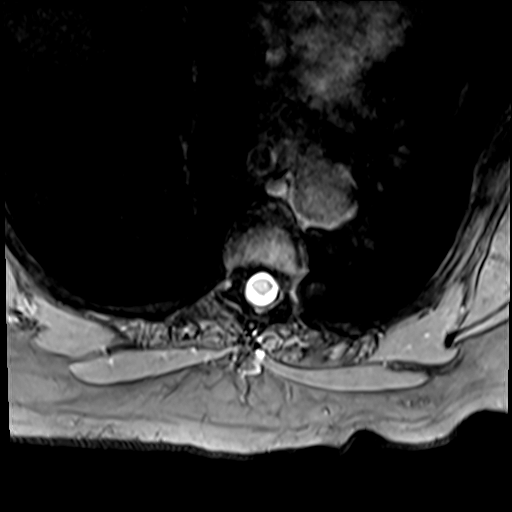
[im 25/42]
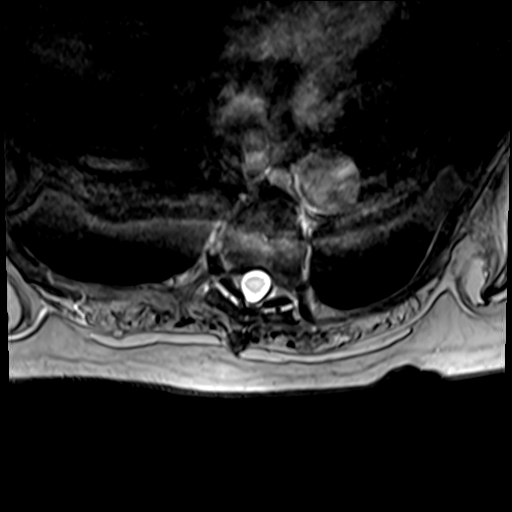
[im 29/42]
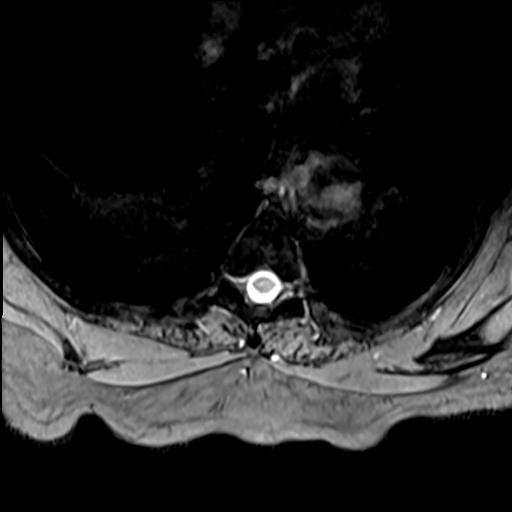
[im 33/42]
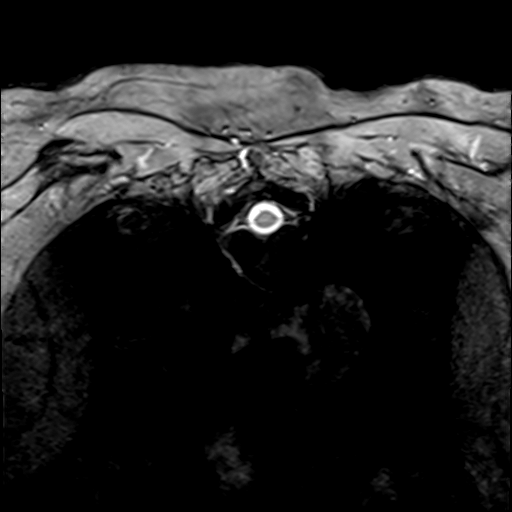
[im 42/42]
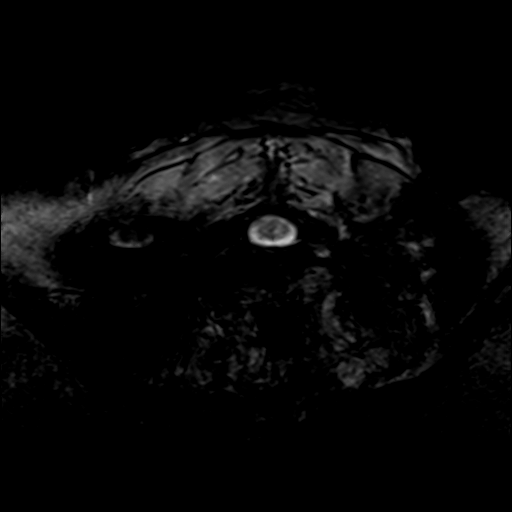

[Series 22: T2 · axial · 4.0mm · 0.78mm/px · z∈[-124,+92]mm · 6 of 21 slices shown (3 of 3)]
[im 1/21]
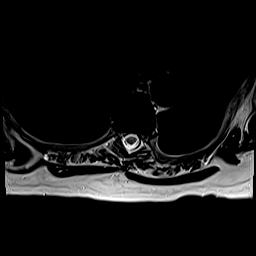
[im 5/21]
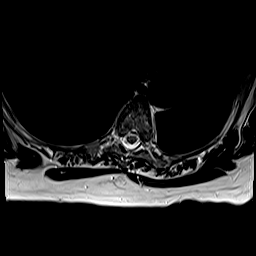
[im 9/21]
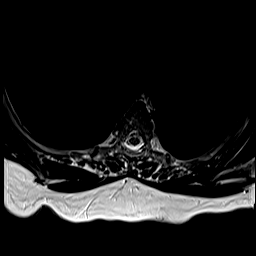
[im 13/21]
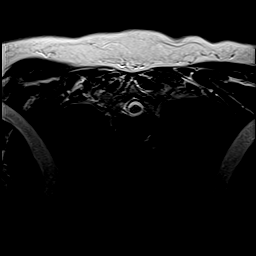
[im 17/21]
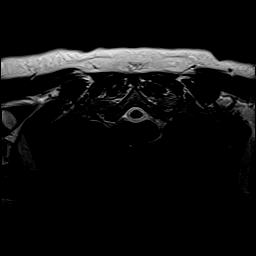
[im 21/21]
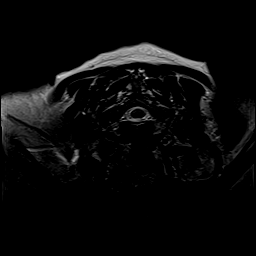

[37 of 48 positions shown; findings below may reference images not displayed]

FINDINGS: Alignment: Increased thoracic kyphotic curvature. Lower thoracic
curvature convex to the right.

Vertebrae: Acute or subacute compression fracture at T8 with loss of
height anteriorly of 40%. No retropulsed bone or canal compromise.
Old healed compression deformity at L1.

Cord:  No cord compression or primary cord lesion.

Paraspinal and other soft tissues: Negative

Disc levels:

No significant disc pathology. No disc herniation. No compressive
stenosis of the canal or foramina. Ordinary facet osteoarthritis
from T9-10 through T11-12. Some edema associated with the facet
region on the right at T8-9. This could be due 2 altered
biomechanics from the T8 compression fracture, but the possibility
of a fracture involving the facet complex is not excluded by MRI.
IMPRESSION: 1. Acute or subacute compression fracture at T8 with loss of height
anteriorly of 40%. No retropulsed bone or canal compromise.
2. Ordinary facet osteoarthritis in the lower thoracic region. Some
edema associated with the facet region on the right at T8-9. This
could be due to altered biomechanics from the T8 compression
fracture, but the possibility of a fracture involving the facet
complex is not excluded by MRI.

## 2020-08-03 DIAGNOSIS — M8000XA Age-related osteoporosis with current pathological fracture, unspecified site, initial encounter for fracture: Secondary | ICD-10-CM | POA: Diagnosis not present

## 2020-08-03 DIAGNOSIS — M4120 Other idiopathic scoliosis, site unspecified: Secondary | ICD-10-CM | POA: Diagnosis not present

## 2020-08-03 DIAGNOSIS — S22000A Wedge compression fracture of unspecified thoracic vertebra, initial encounter for closed fracture: Secondary | ICD-10-CM | POA: Diagnosis not present

## 2020-08-03 DIAGNOSIS — M546 Pain in thoracic spine: Secondary | ICD-10-CM | POA: Diagnosis not present

## 2020-08-03 DIAGNOSIS — M4000 Postural kyphosis, site unspecified: Secondary | ICD-10-CM | POA: Diagnosis not present

## 2020-08-03 MED FILL — OMEPRAZOLE 40 MG CPDR: 40 | 90 days supply | Qty: 90 | Fill #2

## 2020-08-03 MED FILL — PREGABALIN 150 MG CAPS: 150 | 30 days supply | Qty: 60 | Fill #3

## 2020-08-11 ENCOUNTER — Other Ambulatory Visit (HOSPITAL_COMMUNITY): Payer: Self-pay | Admitting: Family Medicine

## 2020-08-12 MED FILL — LORazepam 0.5 MG TABS: 0.5 | 30 days supply | Qty: 90 | Fill #0

## 2020-08-15 ENCOUNTER — Other Ambulatory Visit (HOSPITAL_COMMUNITY): Payer: Self-pay | Admitting: Family Medicine

## 2020-08-16 ENCOUNTER — Other Ambulatory Visit: Payer: Self-pay | Admitting: Family Medicine

## 2020-08-16 DIAGNOSIS — Z1231 Encounter for screening mammogram for malignant neoplasm of breast: Secondary | ICD-10-CM

## 2020-08-17 ENCOUNTER — Other Ambulatory Visit (HOSPITAL_COMMUNITY): Payer: Self-pay | Admitting: Student

## 2020-08-17 MED FILL — HYDROCODON-APAP 5-325: 5-325 | 8 days supply | Qty: 90 | Fill #0

## 2020-08-24 MED FILL — METHOCARBAMOL 750 MG TABS: 750 | 30 days supply | Qty: 90 | Fill #1

## 2020-08-24 MED FILL — CALCITONIN-SALMON 200 UNITS: 200 | 30 days supply | Qty: 4 | Fill #2

## 2020-08-24 MED FILL — SUMATRIPTAN SUCCINATE 100 M: 100 | 90 days supply | Qty: 27 | Fill #1

## 2020-09-06 MED FILL — PREGABALIN 150 MG CAPS: 150 | 30 days supply | Qty: 60 | Fill #4

## 2020-09-06 MED FILL — FAMOTIDINE 40 MG TABS: 40 | 90 days supply | Qty: 90 | Fill #3

## 2020-09-06 MED FILL — CHLORTHALIDONE 25 MG TABS: 25 | 90 days supply | Qty: 90 | Fill #1

## 2020-09-07 ENCOUNTER — Other Ambulatory Visit (HOSPITAL_COMMUNITY): Payer: Self-pay | Admitting: Family Medicine

## 2020-09-07 MED FILL — ZOLPIDEM TARTRATE 10 MG TAB: 10 | 90 days supply | Qty: 90 | Fill #0

## 2020-09-14 DIAGNOSIS — M4000 Postural kyphosis, site unspecified: Secondary | ICD-10-CM | POA: Diagnosis not present

## 2020-09-14 DIAGNOSIS — M4120 Other idiopathic scoliosis, site unspecified: Secondary | ICD-10-CM | POA: Diagnosis not present

## 2020-09-14 DIAGNOSIS — M8000XA Age-related osteoporosis with current pathological fracture, unspecified site, initial encounter for fracture: Secondary | ICD-10-CM | POA: Diagnosis not present

## 2020-09-14 DIAGNOSIS — M546 Pain in thoracic spine: Secondary | ICD-10-CM | POA: Diagnosis not present

## 2020-09-14 DIAGNOSIS — S22000A Wedge compression fracture of unspecified thoracic vertebra, initial encounter for closed fracture: Secondary | ICD-10-CM | POA: Diagnosis not present

## 2020-09-14 DIAGNOSIS — Z6830 Body mass index (BMI) 30.0-30.9, adult: Secondary | ICD-10-CM | POA: Diagnosis not present

## 2020-09-14 DIAGNOSIS — S32010A Wedge compression fracture of first lumbar vertebra, initial encounter for closed fracture: Secondary | ICD-10-CM | POA: Diagnosis not present

## 2020-09-14 DIAGNOSIS — S22060A Wedge compression fracture of T7-T8 vertebra, initial encounter for closed fracture: Secondary | ICD-10-CM | POA: Diagnosis not present

## 2020-09-14 MED FILL — LORazepam 0.5 MG TABS: 0.5 | 30 days supply | Qty: 90 | Fill #0

## 2020-09-14 MED FILL — LOSARTAN POTASSIUM 100 MG T: 100 | 90 days supply | Qty: 90 | Fill #3

## 2020-09-15 ENCOUNTER — Other Ambulatory Visit: Payer: Self-pay

## 2020-09-15 ENCOUNTER — Ambulatory Visit
Admission: RE | Admit: 2020-09-15 | Discharge: 2020-09-15 | Disposition: A | Discharge status: Home / Self Care | Payer: 59 | Source: Ambulatory Visit | Attending: Neurosurgery | Admitting: Neurosurgery

## 2020-09-15 ENCOUNTER — Ambulatory Visit
Admission: RE | Admit: 2020-09-15 | Discharge: 2020-09-15 | Disposition: A | Discharge status: Home / Self Care | Payer: 59 | Source: Ambulatory Visit | Attending: Family Medicine | Admitting: Family Medicine

## 2020-09-15 DIAGNOSIS — M810 Age-related osteoporosis without current pathological fracture: Secondary | ICD-10-CM

## 2020-09-15 DIAGNOSIS — Z1231 Encounter for screening mammogram for malignant neoplasm of breast: Secondary | ICD-10-CM

## 2020-09-15 DIAGNOSIS — Z780 Asymptomatic menopausal state: Secondary | ICD-10-CM | POA: Diagnosis not present

## 2020-09-19 ENCOUNTER — Other Ambulatory Visit (HOSPITAL_COMMUNITY): Payer: Self-pay | Admitting: Neurosurgery

## 2020-09-19 MED FILL — HYDROCODON-APAP 5-325: 5-325 | 8 days supply | Qty: 90 | Fill #0

## 2020-09-23 ENCOUNTER — Other Ambulatory Visit (HOSPITAL_COMMUNITY): Payer: Self-pay | Admitting: Family Medicine

## 2020-09-23 MED FILL — METHOCARBAMOL 750 MG TABS: 750 | 30 days supply | Qty: 90 | Fill #0

## 2020-10-13 ENCOUNTER — Other Ambulatory Visit (HOSPITAL_COMMUNITY): Payer: Self-pay | Admitting: Family Medicine

## 2020-10-13 MED FILL — PREGABALIN 150 MG CAPS: 150 | 30 days supply | Qty: 60 | Fill #5

## 2020-10-14 MED FILL — LORAZEPAM 0.5 MG TABS: 0.5 | 30 days supply | Qty: 90 | Fill #0

## 2020-10-20 DIAGNOSIS — H524 Presbyopia: Secondary | ICD-10-CM | POA: Diagnosis not present

## 2020-10-20 DIAGNOSIS — Z135 Encounter for screening for eye and ear disorders: Secondary | ICD-10-CM | POA: Diagnosis not present

## 2020-10-21 ENCOUNTER — Other Ambulatory Visit (HOSPITAL_COMMUNITY): Payer: Self-pay | Admitting: Neurosurgery

## 2020-10-21 MED FILL — HYDROCODON-APAP 5-325: 5-325 | 8 days supply | Qty: 90 | Fill #0

## 2020-10-21 MED FILL — METHOCARBAMOL 750 MG TABS: 750 | 30 days supply | Qty: 90 | Fill #1

## 2020-10-26 ENCOUNTER — Other Ambulatory Visit (HOSPITAL_COMMUNITY): Payer: Self-pay | Admitting: Family Medicine

## 2020-10-26 DIAGNOSIS — M4000 Postural kyphosis, site unspecified: Secondary | ICD-10-CM | POA: Diagnosis not present

## 2020-10-26 DIAGNOSIS — S22000A Wedge compression fracture of unspecified thoracic vertebra, initial encounter for closed fracture: Secondary | ICD-10-CM | POA: Diagnosis not present

## 2020-10-26 DIAGNOSIS — M4120 Other idiopathic scoliosis, site unspecified: Secondary | ICD-10-CM | POA: Diagnosis not present

## 2020-10-26 DIAGNOSIS — M546 Pain in thoracic spine: Secondary | ICD-10-CM | POA: Diagnosis not present

## 2020-10-26 DIAGNOSIS — Z6831 Body mass index (BMI) 31.0-31.9, adult: Secondary | ICD-10-CM | POA: Diagnosis not present

## 2020-10-26 DIAGNOSIS — M8000XA Age-related osteoporosis with current pathological fracture, unspecified site, initial encounter for fracture: Secondary | ICD-10-CM | POA: Diagnosis not present

## 2020-10-27 ENCOUNTER — Inpatient Hospital Stay: Admission: RE | Admit: 2020-10-27 | Payer: 59 | Source: Ambulatory Visit

## 2020-10-27 MED FILL — ALENDRONATE NA 70 MG TAB: 70 | 28 days supply | Qty: 4 | Fill #0

## 2020-11-14 ENCOUNTER — Other Ambulatory Visit (HOSPITAL_COMMUNITY): Payer: Self-pay | Admitting: Family Medicine

## 2020-11-14 MED FILL — PREGABALIN 150 MG CAPS: 150 | 30 days supply | Qty: 60 | Fill #0

## 2020-11-17 ENCOUNTER — Other Ambulatory Visit: Payer: 59

## 2020-11-17 ENCOUNTER — Ambulatory Visit: Payer: 59

## 2020-11-18 ENCOUNTER — Other Ambulatory Visit (HOSPITAL_COMMUNITY): Payer: Self-pay | Admitting: Neurosurgery

## 2020-11-18 MED FILL — HYDROCODON-APAP 5-325: 5-325 | 8 days supply | Qty: 90 | Fill #0

## 2020-11-18 MED FILL — METHOCARBAMOL 750 MG TABS: 750 | 30 days supply | Qty: 90 | Fill #2

## 2020-11-25 ENCOUNTER — Other Ambulatory Visit (HOSPITAL_COMMUNITY): Payer: Self-pay | Admitting: Family Medicine

## 2020-11-26 ENCOUNTER — Other Ambulatory Visit (HOSPITAL_COMMUNITY): Payer: Self-pay

## 2020-11-26 MED FILL — Lidocaine HCl Viscous Soln 2%: OROMUCOSAL | 6 days supply | Qty: 100 | Fill #0 | Status: AC

## 2020-11-27 ENCOUNTER — Other Ambulatory Visit (HOSPITAL_COMMUNITY): Payer: Self-pay

## 2020-11-28 ENCOUNTER — Other Ambulatory Visit (HOSPITAL_COMMUNITY): Payer: Self-pay

## 2020-11-28 MED FILL — Sumatriptan Succinate Tab 100 MG: ORAL | 90 days supply | Qty: 27 | Fill #0 | Status: AC

## 2020-11-29 ENCOUNTER — Other Ambulatory Visit (HOSPITAL_COMMUNITY): Payer: Self-pay

## 2020-12-05 ENCOUNTER — Other Ambulatory Visit (HOSPITAL_COMMUNITY): Payer: Self-pay

## 2020-12-05 MED FILL — Zolpidem Tartrate Tab 10 MG: ORAL | 90 days supply | Qty: 90 | Fill #0 | Status: AC

## 2020-12-05 MED FILL — Alendronate Sodium Tab 70 MG: ORAL | 28 days supply | Qty: 4 | Fill #0 | Status: AC

## 2020-12-07 ENCOUNTER — Other Ambulatory Visit (HOSPITAL_COMMUNITY): Payer: Self-pay

## 2020-12-08 ENCOUNTER — Other Ambulatory Visit (HOSPITAL_COMMUNITY): Payer: Self-pay

## 2020-12-08 MED ORDER — FAMOTIDINE 40 MG PO TABS
40.0000 mg | ORAL_TABLET | Freq: Every day | ORAL | 4 refills | Status: DC
  Filled 2020-12-08: qty 90, 90d supply, fill #0
  Filled 2021-03-12: qty 90, 90d supply, fill #1
  Filled 2021-06-20: qty 90, 90d supply, fill #2

## 2020-12-11 ENCOUNTER — Other Ambulatory Visit (HOSPITAL_COMMUNITY): Payer: Self-pay

## 2020-12-11 MED FILL — Chlorthalidone Tab 25 MG: ORAL | 90 days supply | Qty: 90 | Fill #0 | Status: AC

## 2020-12-12 ENCOUNTER — Other Ambulatory Visit (HOSPITAL_COMMUNITY): Payer: Self-pay

## 2020-12-12 MED ORDER — LORAZEPAM 0.5 MG PO TABS
0.5000 mg | ORAL_TABLET | Freq: Three times a day (TID) | ORAL | 0 refills | Status: DC | PRN
  Filled 2020-12-12: qty 90, 30d supply, fill #0

## 2020-12-19 ENCOUNTER — Ambulatory Visit
Admission: RE | Admit: 2020-12-19 | Discharge: 2020-12-19 | Disposition: A | Discharge status: Home / Self Care | Payer: 59 | Source: Ambulatory Visit | Attending: Family Medicine | Admitting: Family Medicine

## 2020-12-19 ENCOUNTER — Other Ambulatory Visit (HOSPITAL_COMMUNITY): Payer: Self-pay

## 2020-12-19 ENCOUNTER — Other Ambulatory Visit: Payer: Self-pay

## 2020-12-19 DIAGNOSIS — Z1231 Encounter for screening mammogram for malignant neoplasm of breast: Secondary | ICD-10-CM | POA: Diagnosis not present

## 2020-12-19 IMAGING — MG MM DIGITAL SCREENING BILAT W/ TOMO AND CAD
6 of 10 series · 6 of 30 positions shown · non-contrast
Comparison: Previous exam(s).

CLINICAL DATA: Screening.

EXAM:
DIGITAL SCREENING BILATERAL MAMMOGRAM WITH TOMOSYNTHESIS AND CAD
TECHNIQUE: Bilateral screening digital craniocaudal and mediolateral oblique
mammograms were obtained. Bilateral screening digital breast
tomosynthesis was performed. The images were evaluated with
computer-aided detection.

[L MLO synth-2D]
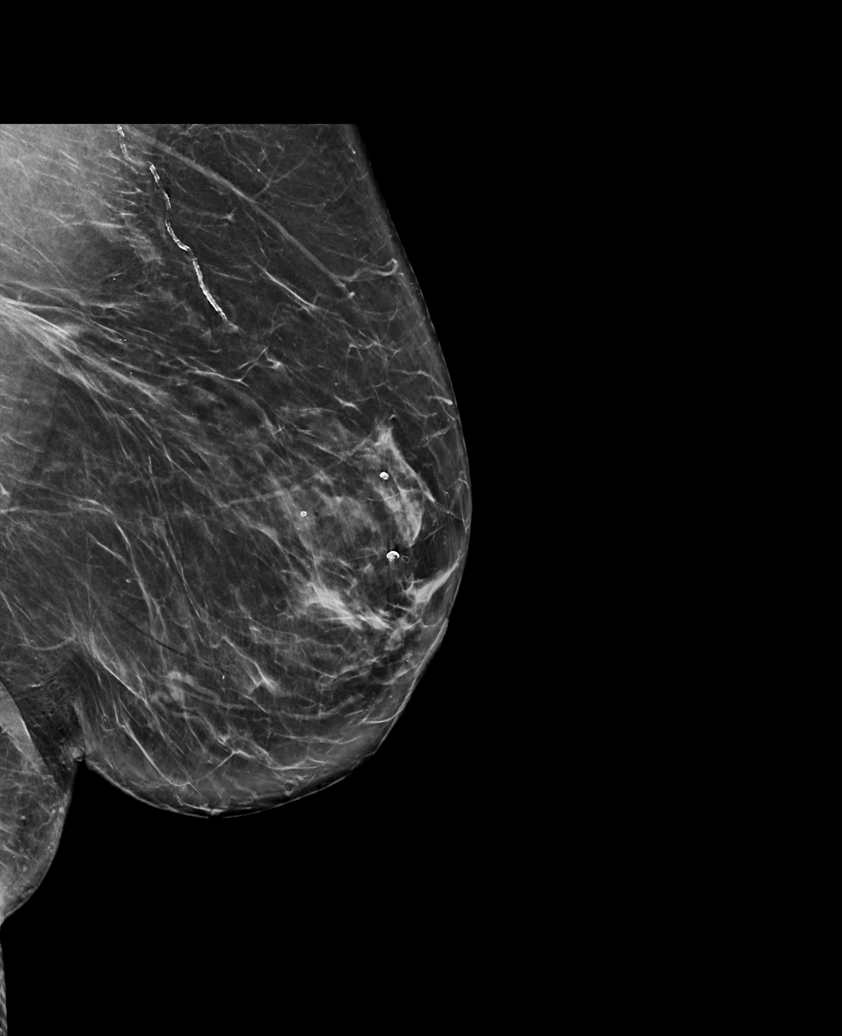

[R MLO synth-2D]
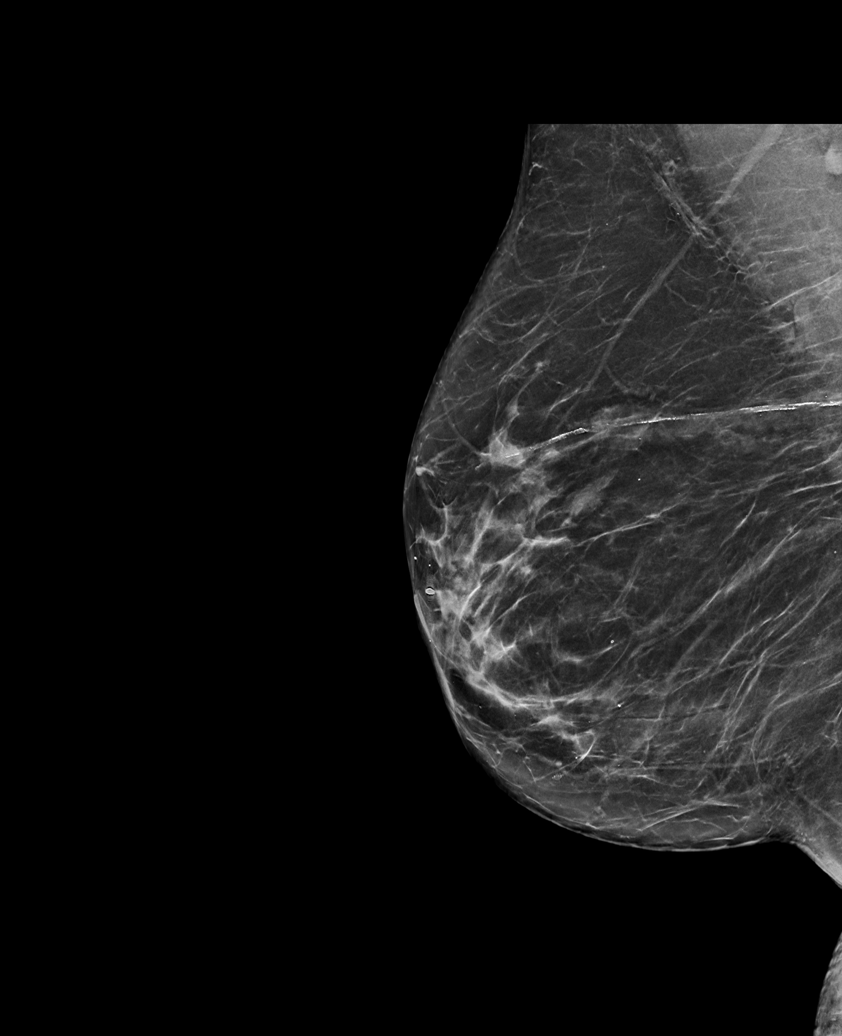

[L CC synth-2D (1 of 2)]
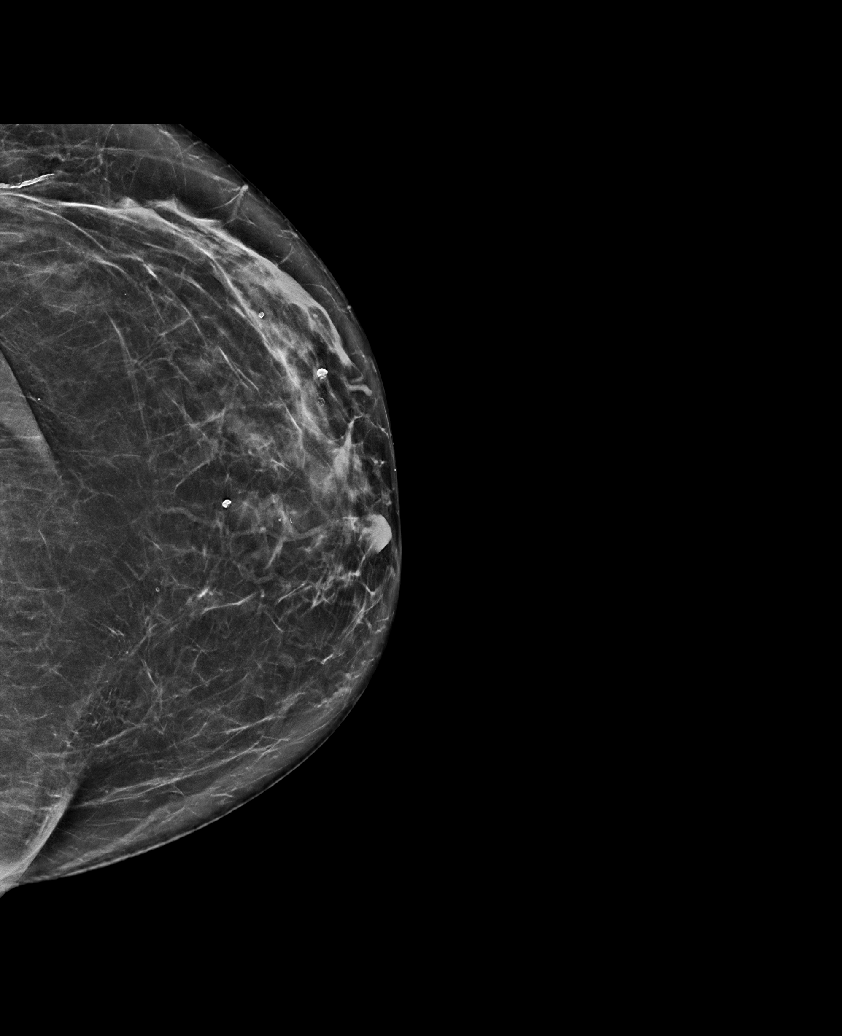

[R CC synth-2D]
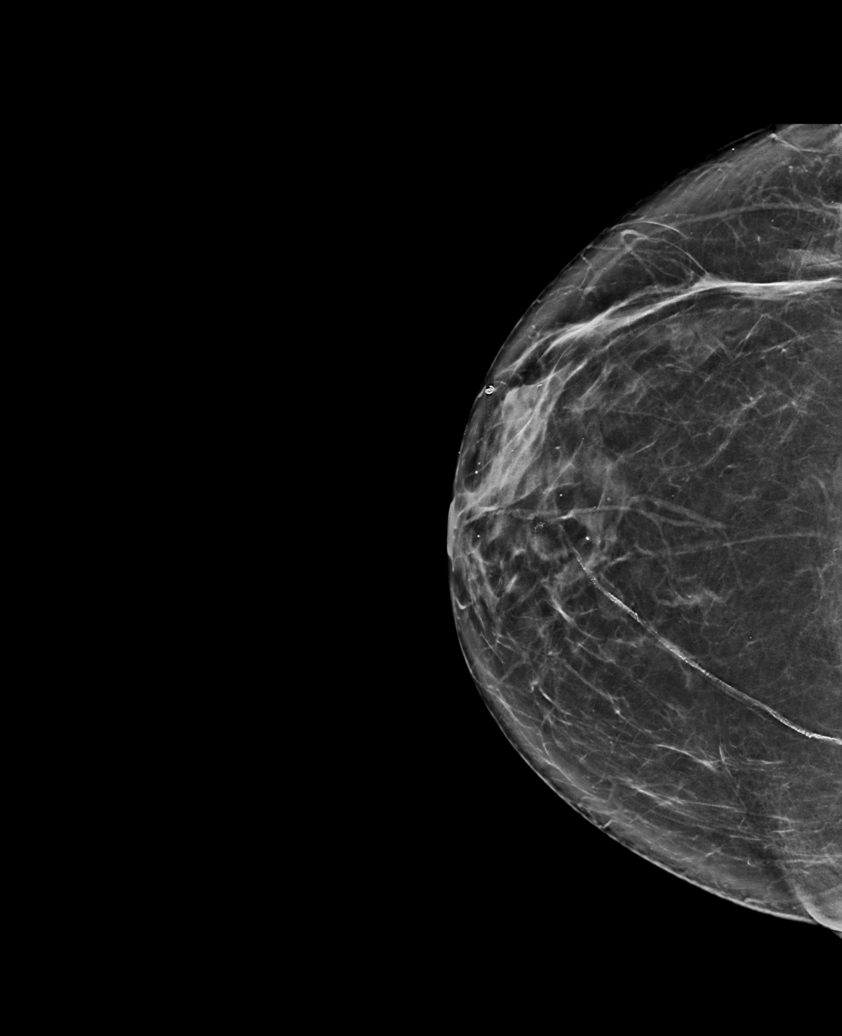

[L CC synth-2D (2 of 2)]
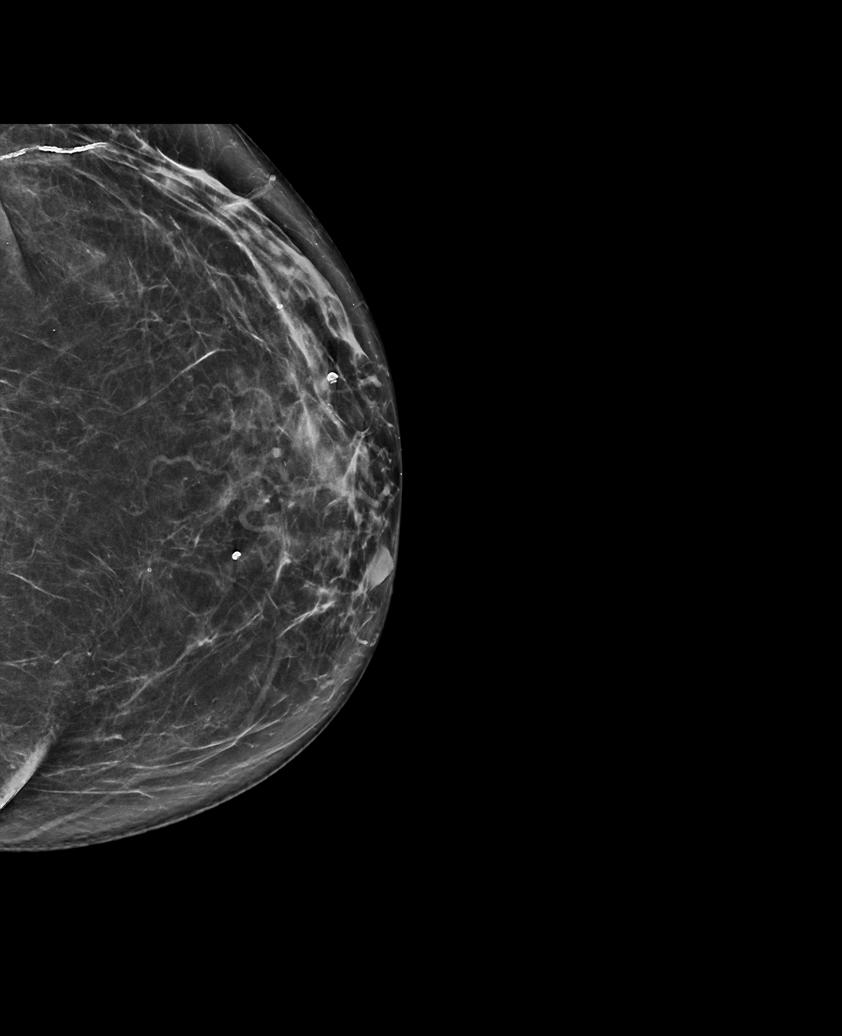

[R MLO tomo · tomo slice 37/72.0]
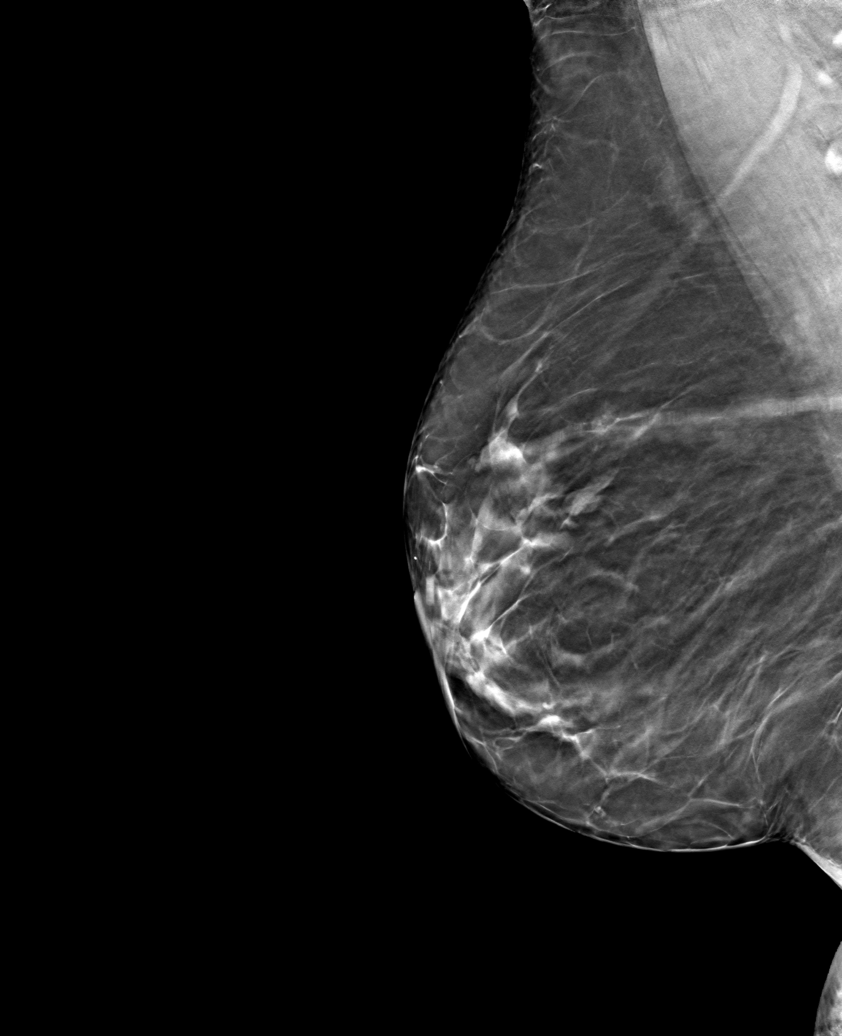

[6 of 30 positions shown; findings below may reference images not displayed]

ACR Breast Density Category b: There are scattered areas of
fibroglandular density.
FINDINGS: There are no findings suspicious for malignancy. The images were
evaluated with computer-aided detection.
IMPRESSION: No mammographic evidence of malignancy. A result letter of this
screening mammogram will be mailed directly to the patient.

RECOMMENDATION:
Screening mammogram in one year. (Code:WJ-I-BG6)

BI-RADS CATEGORY  1: Negative.

## 2020-12-19 MED ORDER — LOSARTAN POTASSIUM 100 MG PO TABS
1.0000 | ORAL_TABLET | Freq: Every day | ORAL | 4 refills | Status: DC
  Filled 2020-12-19: qty 90, 90d supply, fill #0
  Filled 2021-04-10: qty 90, 90d supply, fill #1

## 2020-12-19 MED ORDER — METHOCARBAMOL 750 MG PO TABS
ORAL_TABLET | ORAL | 2 refills | Status: DC
  Filled 2020-12-19: qty 90, 30d supply, fill #0
  Filled 2021-01-18: qty 90, 30d supply, fill #1
  Filled 2021-02-18: qty 90, 30d supply, fill #2

## 2020-12-19 MED FILL — Pregabalin Cap 150 MG: ORAL | 30 days supply | Qty: 60 | Fill #0 | Status: AC

## 2020-12-20 ENCOUNTER — Other Ambulatory Visit (HOSPITAL_COMMUNITY): Payer: Self-pay

## 2020-12-20 MED ORDER — HYDROCODONE-ACETAMINOPHEN 5-325 MG PO TABS
ORAL_TABLET | ORAL | 0 refills | Status: DC
  Filled 2020-12-20: qty 90, 8d supply, fill #0

## 2020-12-22 ENCOUNTER — Other Ambulatory Visit (HOSPITAL_COMMUNITY): Payer: Self-pay

## 2021-01-05 DIAGNOSIS — E559 Vitamin D deficiency, unspecified: Secondary | ICD-10-CM | POA: Diagnosis not present

## 2021-01-05 DIAGNOSIS — Z6829 Body mass index (BMI) 29.0-29.9, adult: Secondary | ICD-10-CM | POA: Diagnosis not present

## 2021-01-05 DIAGNOSIS — I10 Essential (primary) hypertension: Secondary | ICD-10-CM | POA: Diagnosis not present

## 2021-01-05 DIAGNOSIS — G500 Trigeminal neuralgia: Secondary | ICD-10-CM | POA: Diagnosis not present

## 2021-01-05 DIAGNOSIS — Z23 Encounter for immunization: Secondary | ICD-10-CM | POA: Diagnosis not present

## 2021-01-05 DIAGNOSIS — K219 Gastro-esophageal reflux disease without esophagitis: Secondary | ICD-10-CM | POA: Diagnosis not present

## 2021-01-05 DIAGNOSIS — H16139 Photokeratitis, unspecified eye: Secondary | ICD-10-CM | POA: Diagnosis not present

## 2021-01-05 DIAGNOSIS — G43909 Migraine, unspecified, not intractable, without status migrainosus: Secondary | ICD-10-CM | POA: Diagnosis not present

## 2021-01-05 DIAGNOSIS — M810 Age-related osteoporosis without current pathological fracture: Secondary | ICD-10-CM | POA: Diagnosis not present

## 2021-01-06 ENCOUNTER — Other Ambulatory Visit (HOSPITAL_COMMUNITY)
Admission: RE | Admit: 2021-01-06 | Discharge: 2021-01-06 | Disposition: A | Discharge status: Home / Self Care | Payer: 59 | Source: Ambulatory Visit | Attending: Family Medicine | Admitting: Family Medicine

## 2021-01-06 ENCOUNTER — Other Ambulatory Visit (HOSPITAL_COMMUNITY): Payer: Self-pay

## 2021-01-06 DIAGNOSIS — K219 Gastro-esophageal reflux disease without esophagitis: Secondary | ICD-10-CM | POA: Insufficient documentation

## 2021-01-06 DIAGNOSIS — M810 Age-related osteoporosis without current pathological fracture: Secondary | ICD-10-CM | POA: Insufficient documentation

## 2021-01-06 DIAGNOSIS — I10 Essential (primary) hypertension: Secondary | ICD-10-CM | POA: Insufficient documentation

## 2021-01-06 DIAGNOSIS — E559 Vitamin D deficiency, unspecified: Secondary | ICD-10-CM | POA: Diagnosis not present

## 2021-01-06 LAB — COMPREHENSIVE METABOLIC PANEL
ALT: 20 U/L (ref 0–44)
AST: 21 U/L (ref 15–41)
Albumin: 4.5 g/dL (ref 3.5–5.0)
Alkaline Phosphatase: 63 U/L (ref 38–126)
Anion gap: 7 (ref 5–15)
BUN: 22 mg/dL — ABNORMAL HIGH (ref 6–20)
CO2: 28 mmol/L (ref 22–32)
Calcium: 9.8 mg/dL (ref 8.9–10.3)
Chloride: 105 mmol/L (ref 98–111)
Creatinine, Ser: 0.93 mg/dL (ref 0.44–1.00)
GFR, Estimated: >60 mL/min (ref >60)
Glucose, Bld: 106 mg/dL — ABNORMAL HIGH (ref 70–99)
Potassium: 3.7 mmol/L (ref 3.5–5.1)
Sodium: 140 mmol/L (ref 135–145)
Total Bilirubin: 0.7 mg/dL (ref 0.3–1.2)
Total Protein: 7 g/dL (ref 6.5–8.1)

## 2021-01-06 LAB — CBC
HCT: 42 % (ref 36.0–46.0)
Hemoglobin: 13.8 g/dL (ref 12.0–15.0)
MCH: 30.2 pg (ref 26.0–34.0)
MCHC: 32.9 g/dL (ref 30.0–36.0)
MCV: 91.9 fL (ref 80.0–100.0)
Platelets: 272 10*3/uL (ref 150–400)
RBC: 4.57 MIL/uL (ref 3.87–5.11)
RDW: 12.8 % (ref 11.5–15.5)
WBC: 6.4 10*3/uL (ref 4.0–10.5)
nRBC: 0 % (ref 0.0–0.2)

## 2021-01-06 LAB — TSH: TSH: 0.513 u[IU]/mL (ref 0.350–4.500)

## 2021-01-06 LAB — LIPID PANEL
Cholesterol: 189 mg/dL (ref 0–200)
HDL: 76 mg/dL (ref >40)
LDL Cholesterol: 95 mg/dL (ref 0–99)
Total CHOL/HDL Ratio: 2.5 RATIO
Triglycerides: 90 mg/dL (ref <150)
VLDL: 18 mg/dL (ref 0–40)

## 2021-01-06 LAB — VITAMIN D 25 HYDROXY (VIT D DEFICIENCY, FRACTURES): Vit D, 25-Hydroxy: 32.02 ng/mL (ref 30–100)

## 2021-01-06 MED FILL — Alendronate Sodium Tab 70 MG: ORAL | 28 days supply | Qty: 4 | Fill #1 | Status: AC

## 2021-01-09 ENCOUNTER — Other Ambulatory Visit (HOSPITAL_COMMUNITY): Payer: Self-pay

## 2021-01-10 ENCOUNTER — Other Ambulatory Visit (HOSPITAL_COMMUNITY): Payer: Self-pay

## 2021-01-10 MED FILL — Lidocaine HCl Viscous Soln 2%: OROMUCOSAL | 6 days supply | Qty: 100 | Fill #1 | Status: AC

## 2021-01-11 ENCOUNTER — Other Ambulatory Visit (HOSPITAL_COMMUNITY): Payer: Self-pay

## 2021-01-11 MED ORDER — LORAZEPAM 0.5 MG PO TABS
0.5000 mg | ORAL_TABLET | Freq: Three times a day (TID) | ORAL | 2 refills | Status: DC | PRN
  Filled 2021-01-11: qty 90, 30d supply, fill #0
  Filled 2021-02-10: qty 90, 30d supply, fill #1
  Filled 2021-03-10: qty 90, 30d supply, fill #2

## 2021-01-11 MED ORDER — ONDANSETRON 8 MG PO TBDP
ORAL_TABLET | ORAL | 1 refills | Status: DC
  Filled 2021-01-11: qty 30, 10d supply, fill #0

## 2021-01-19 ENCOUNTER — Other Ambulatory Visit (HOSPITAL_COMMUNITY): Payer: Self-pay

## 2021-01-20 ENCOUNTER — Other Ambulatory Visit (HOSPITAL_COMMUNITY): Payer: Self-pay

## 2021-01-20 MED ORDER — PHENTERMINE HCL 37.5 MG PO TABS
37.5000 mg | ORAL_TABLET | Freq: Every day | ORAL | 0 refills | Status: DC
  Filled 2021-01-20: qty 60, 60d supply, fill #0

## 2021-01-20 MED ORDER — HYDROCODONE-ACETAMINOPHEN 5-325 MG PO TABS
ORAL_TABLET | ORAL | 0 refills | Status: DC
  Filled 2021-01-20: qty 90, 8d supply, fill #0

## 2021-01-31 MED FILL — Alendronate Sodium Tab 70 MG: ORAL | 28 days supply | Qty: 4 | Fill #2 | Status: AC

## 2021-01-31 MED FILL — Pregabalin Cap 150 MG: ORAL | 30 days supply | Qty: 60 | Fill #1 | Status: AC

## 2021-02-01 ENCOUNTER — Other Ambulatory Visit (HOSPITAL_COMMUNITY): Payer: Self-pay

## 2021-02-06 ENCOUNTER — Other Ambulatory Visit (HOSPITAL_COMMUNITY): Payer: Self-pay

## 2021-02-06 DIAGNOSIS — Z1152 Encounter for screening for COVID-19: Secondary | ICD-10-CM | POA: Diagnosis not present

## 2021-02-06 DIAGNOSIS — J040 Acute laryngitis: Secondary | ICD-10-CM | POA: Diagnosis not present

## 2021-02-07 ENCOUNTER — Other Ambulatory Visit (HOSPITAL_COMMUNITY): Payer: Self-pay

## 2021-02-07 MED ORDER — OMEPRAZOLE 40 MG PO CPDR
1.0000 | DELAYED_RELEASE_CAPSULE | Freq: Every day | ORAL | 4 refills | Status: DC
  Filled 2021-02-07: qty 90, 90d supply, fill #0
  Filled 2021-05-09: qty 90, 90d supply, fill #1
  Filled 2021-08-11: qty 90, 90d supply, fill #2
  Filled 2021-11-12: qty 90, 90d supply, fill #3

## 2021-02-08 ENCOUNTER — Other Ambulatory Visit (HOSPITAL_COMMUNITY): Payer: Self-pay

## 2021-02-10 ENCOUNTER — Other Ambulatory Visit (HOSPITAL_COMMUNITY): Payer: Self-pay

## 2021-02-18 ENCOUNTER — Other Ambulatory Visit (HOSPITAL_COMMUNITY): Payer: Self-pay

## 2021-02-19 ENCOUNTER — Other Ambulatory Visit (HOSPITAL_COMMUNITY): Payer: Self-pay

## 2021-02-20 ENCOUNTER — Other Ambulatory Visit (HOSPITAL_COMMUNITY): Payer: Self-pay

## 2021-02-20 MED ORDER — LIDOCAINE VISCOUS HCL 2 % MT SOLN
OROMUCOSAL | 1 refills | Status: DC
  Filled 2021-02-20: qty 100, 6d supply, fill #0
  Filled 2021-04-18: qty 100, 6d supply, fill #1

## 2021-02-20 MED ORDER — HYDROCODONE-ACETAMINOPHEN 5-325 MG PO TABS
ORAL_TABLET | ORAL | 0 refills | Status: DC
  Filled 2021-02-20: qty 90, 8d supply, fill #0

## 2021-02-21 ENCOUNTER — Other Ambulatory Visit (HOSPITAL_COMMUNITY): Payer: Self-pay

## 2021-03-05 MED FILL — Zolpidem Tartrate Tab 10 MG: ORAL | 90 days supply | Qty: 90 | Fill #1 | Status: AC

## 2021-03-05 MED FILL — Chlorthalidone Tab 25 MG: ORAL | 90 days supply | Qty: 90 | Fill #1 | Status: AC

## 2021-03-05 MED FILL — Alendronate Sodium Tab 70 MG: ORAL | 28 days supply | Qty: 4 | Fill #3 | Status: AC

## 2021-03-05 MED FILL — Pregabalin Cap 150 MG: ORAL | 30 days supply | Qty: 60 | Fill #2 | Status: AC

## 2021-03-06 ENCOUNTER — Other Ambulatory Visit (HOSPITAL_COMMUNITY): Payer: Self-pay

## 2021-03-08 DIAGNOSIS — M8000XA Age-related osteoporosis with current pathological fracture, unspecified site, initial encounter for fracture: Secondary | ICD-10-CM | POA: Diagnosis not present

## 2021-03-08 DIAGNOSIS — M4000 Postural kyphosis, site unspecified: Secondary | ICD-10-CM | POA: Diagnosis not present

## 2021-03-08 DIAGNOSIS — M4120 Other idiopathic scoliosis, site unspecified: Secondary | ICD-10-CM | POA: Diagnosis not present

## 2021-03-08 DIAGNOSIS — S22000A Wedge compression fracture of unspecified thoracic vertebra, initial encounter for closed fracture: Secondary | ICD-10-CM | POA: Diagnosis not present

## 2021-03-08 DIAGNOSIS — M546 Pain in thoracic spine: Secondary | ICD-10-CM | POA: Diagnosis not present

## 2021-03-10 ENCOUNTER — Other Ambulatory Visit (HOSPITAL_COMMUNITY): Payer: Self-pay

## 2021-03-13 ENCOUNTER — Other Ambulatory Visit (HOSPITAL_COMMUNITY): Payer: Self-pay

## 2021-03-19 ENCOUNTER — Other Ambulatory Visit (HOSPITAL_COMMUNITY): Payer: Self-pay

## 2021-03-20 ENCOUNTER — Other Ambulatory Visit (HOSPITAL_COMMUNITY): Payer: Self-pay

## 2021-03-20 MED ORDER — METHOCARBAMOL 750 MG PO TABS
ORAL_TABLET | ORAL | 2 refills | Status: DC
  Filled 2021-03-20: qty 90, 30d supply, fill #0
  Filled 2021-04-18: qty 90, 30d supply, fill #1
  Filled 2021-05-21: qty 90, 30d supply, fill #2

## 2021-03-22 ENCOUNTER — Other Ambulatory Visit (HOSPITAL_COMMUNITY): Payer: Self-pay

## 2021-03-23 ENCOUNTER — Other Ambulatory Visit (HOSPITAL_COMMUNITY): Payer: Self-pay

## 2021-03-24 ENCOUNTER — Other Ambulatory Visit (HOSPITAL_COMMUNITY): Payer: Self-pay

## 2021-03-24 MED ORDER — HYDROCODONE-ACETAMINOPHEN 5-325 MG PO TABS
ORAL_TABLET | ORAL | 0 refills | Status: DC
  Filled 2021-03-24: qty 90, 8d supply, fill #0

## 2021-03-31 ENCOUNTER — Other Ambulatory Visit (HOSPITAL_COMMUNITY): Payer: Self-pay

## 2021-03-31 MED ORDER — LOSARTAN POTASSIUM 100 MG PO TABS
100.0000 mg | ORAL_TABLET | Freq: Every day | ORAL | 4 refills | Status: DC
  Filled 2021-03-31 – 2021-07-13 (×2): qty 90, 90d supply, fill #0
  Filled 2021-10-16: qty 90, 90d supply, fill #1
  Filled 2022-01-24: qty 90, 90d supply, fill #2

## 2021-04-08 ENCOUNTER — Other Ambulatory Visit (HOSPITAL_COMMUNITY): Payer: Self-pay

## 2021-04-10 ENCOUNTER — Other Ambulatory Visit (HOSPITAL_COMMUNITY): Payer: Self-pay

## 2021-04-10 MED FILL — Alendronate Sodium Tab 70 MG: ORAL | 28 days supply | Qty: 4 | Fill #4 | Status: AC

## 2021-04-10 MED FILL — Pregabalin Cap 150 MG: ORAL | 30 days supply | Qty: 60 | Fill #3 | Status: AC

## 2021-04-11 ENCOUNTER — Other Ambulatory Visit (HOSPITAL_COMMUNITY): Payer: Self-pay

## 2021-04-11 MED ORDER — LORAZEPAM 0.5 MG PO TABS
0.5000 mg | ORAL_TABLET | Freq: Three times a day (TID) | ORAL | 0 refills | Status: DC | PRN
  Filled 2021-04-11: qty 90, 30d supply, fill #0

## 2021-04-19 ENCOUNTER — Other Ambulatory Visit (HOSPITAL_COMMUNITY): Payer: Self-pay

## 2021-04-20 ENCOUNTER — Other Ambulatory Visit (HOSPITAL_COMMUNITY): Payer: Self-pay

## 2021-04-20 MED ORDER — HYDROCODONE-ACETAMINOPHEN 5-325 MG PO TABS
1.0000 | ORAL_TABLET | ORAL | 0 refills | Status: DC
  Filled 2021-04-20: qty 90, 8d supply, fill #0

## 2021-04-27 ENCOUNTER — Other Ambulatory Visit (HOSPITAL_COMMUNITY): Payer: Self-pay

## 2021-05-05 ENCOUNTER — Other Ambulatory Visit (HOSPITAL_COMMUNITY): Payer: Self-pay

## 2021-05-06 ENCOUNTER — Other Ambulatory Visit (HOSPITAL_COMMUNITY): Payer: Self-pay

## 2021-05-06 MED ORDER — SUMATRIPTAN SUCCINATE 100 MG PO TABS
100.0000 mg | ORAL_TABLET | ORAL | 3 refills | Status: DC | PRN
  Filled 2021-05-06: qty 27, 90d supply, fill #0
  Filled 2021-07-13: qty 27, 90d supply, fill #1
  Filled 2021-11-14 – 2021-12-04 (×2): qty 27, 90d supply, fill #2
  Filled 2022-03-20: qty 27, 90d supply, fill #3

## 2021-05-07 MED FILL — Alendronate Sodium Tab 70 MG: ORAL | 28 days supply | Qty: 4 | Fill #5 | Status: AC

## 2021-05-08 ENCOUNTER — Other Ambulatory Visit (HOSPITAL_COMMUNITY): Payer: Self-pay

## 2021-05-09 ENCOUNTER — Other Ambulatory Visit (HOSPITAL_COMMUNITY): Payer: Self-pay

## 2021-05-09 MED FILL — Pregabalin Cap 150 MG: ORAL | 30 days supply | Qty: 60 | Fill #4 | Status: AC

## 2021-05-10 ENCOUNTER — Other Ambulatory Visit (HOSPITAL_COMMUNITY): Payer: Self-pay

## 2021-05-11 ENCOUNTER — Other Ambulatory Visit (HOSPITAL_COMMUNITY): Payer: Self-pay

## 2021-05-11 MED ORDER — LORAZEPAM 0.5 MG PO TABS
0.5000 mg | ORAL_TABLET | Freq: Three times a day (TID) | ORAL | 1 refills | Status: DC | PRN
  Filled 2021-05-11: qty 90, 30d supply, fill #0
  Filled 2021-06-08: qty 90, 30d supply, fill #1

## 2021-05-21 ENCOUNTER — Other Ambulatory Visit (HOSPITAL_COMMUNITY): Payer: Self-pay

## 2021-05-22 ENCOUNTER — Other Ambulatory Visit (HOSPITAL_COMMUNITY): Payer: Self-pay

## 2021-05-22 MED ORDER — HYDROCODONE-ACETAMINOPHEN 5-325 MG PO TABS
1.0000 | ORAL_TABLET | ORAL | 0 refills | Status: DC
  Filled 2021-05-22: qty 90, 8d supply, fill #0

## 2021-05-23 ENCOUNTER — Other Ambulatory Visit (HOSPITAL_COMMUNITY): Payer: Self-pay

## 2021-05-23 MED ORDER — LIDOCAINE VISCOUS HCL 2 % MT SOLN
OROMUCOSAL | 1 refills | Status: DC
  Filled 2021-05-23: qty 100, 7d supply, fill #0
  Filled 2021-06-20: qty 100, 7d supply, fill #1

## 2021-05-24 ENCOUNTER — Other Ambulatory Visit (HOSPITAL_COMMUNITY): Payer: Self-pay

## 2021-05-24 MED ORDER — ZOLPIDEM TARTRATE 10 MG PO TABS
10.0000 mg | ORAL_TABLET | Freq: Every day | ORAL | 1 refills | Status: DC
  Filled 2021-05-24 – 2021-06-02 (×4): qty 90, 90d supply, fill #0
  Filled 2021-09-06: qty 90, 90d supply, fill #1
  Filled ????-??-?? (×2): fill #0

## 2021-05-26 DIAGNOSIS — Z23 Encounter for immunization: Secondary | ICD-10-CM | POA: Diagnosis not present

## 2021-05-30 ENCOUNTER — Other Ambulatory Visit (HOSPITAL_COMMUNITY): Payer: Self-pay

## 2021-05-31 ENCOUNTER — Other Ambulatory Visit (HOSPITAL_COMMUNITY): Payer: Self-pay

## 2021-06-02 ENCOUNTER — Other Ambulatory Visit (HOSPITAL_COMMUNITY): Payer: Self-pay

## 2021-06-08 ENCOUNTER — Other Ambulatory Visit (HOSPITAL_COMMUNITY): Payer: Self-pay

## 2021-06-09 ENCOUNTER — Other Ambulatory Visit (HOSPITAL_COMMUNITY): Payer: Self-pay

## 2021-06-09 MED ORDER — CHLORTHALIDONE 25 MG PO TABS
25.0000 mg | ORAL_TABLET | Freq: Every day | ORAL | 3 refills | Status: DC
  Filled 2021-06-09: qty 90, 90d supply, fill #0
  Filled 2021-09-14: qty 90, 90d supply, fill #1
  Filled 2021-12-18: qty 90, 90d supply, fill #2
  Filled 2022-04-12: qty 90, 90d supply, fill #3

## 2021-06-09 MED ORDER — PREGABALIN 150 MG PO CAPS
150.0000 mg | ORAL_CAPSULE | Freq: Two times a day (BID) | ORAL | 5 refills | Status: DC
  Filled 2021-06-09: qty 60, 30d supply, fill #0
  Filled 2021-07-13: qty 60, 30d supply, fill #1
  Filled 2021-08-11: qty 60, 30d supply, fill #2
  Filled 2021-09-14: qty 60, 30d supply, fill #3
  Filled 2021-10-16: qty 60, 30d supply, fill #4
  Filled 2021-11-12: qty 60, 30d supply, fill #5

## 2021-06-10 ENCOUNTER — Other Ambulatory Visit (HOSPITAL_COMMUNITY): Payer: Self-pay

## 2021-06-12 ENCOUNTER — Other Ambulatory Visit (HOSPITAL_COMMUNITY): Payer: Self-pay

## 2021-06-19 ENCOUNTER — Other Ambulatory Visit (HOSPITAL_COMMUNITY): Payer: Self-pay

## 2021-06-19 MED FILL — Alendronate Sodium Tab 70 MG: ORAL | 28 days supply | Qty: 4 | Fill #6 | Status: AC

## 2021-06-20 ENCOUNTER — Other Ambulatory Visit (HOSPITAL_COMMUNITY): Payer: Self-pay

## 2021-06-20 MED ORDER — METHOCARBAMOL 750 MG PO TABS
750.0000 mg | ORAL_TABLET | Freq: Three times a day (TID) | ORAL | 2 refills | Status: DC
  Filled 2021-06-20: qty 90, 30d supply, fill #0
  Filled 2021-07-24: qty 90, 30d supply, fill #1
  Filled 2021-08-11 – 2021-08-22 (×2): qty 90, 30d supply, fill #2

## 2021-06-20 MED ORDER — HYDROCODONE-ACETAMINOPHEN 5-325 MG PO TABS
1.0000 | ORAL_TABLET | ORAL | 0 refills | Status: DC
  Filled 2021-06-20: qty 90, 8d supply, fill #0

## 2021-06-21 ENCOUNTER — Other Ambulatory Visit (HOSPITAL_COMMUNITY): Payer: Self-pay

## 2021-07-13 ENCOUNTER — Other Ambulatory Visit (HOSPITAL_COMMUNITY): Payer: Self-pay

## 2021-07-13 ENCOUNTER — Other Ambulatory Visit (HOSPITAL_COMMUNITY)
Admission: RE | Admit: 2021-07-13 | Discharge: 2021-07-13 | Disposition: A | Discharge status: Home / Self Care | Payer: 59 | Source: Ambulatory Visit | Attending: Family Medicine | Admitting: Family Medicine

## 2021-07-13 DIAGNOSIS — M810 Age-related osteoporosis without current pathological fracture: Secondary | ICD-10-CM | POA: Diagnosis not present

## 2021-07-13 DIAGNOSIS — E559 Vitamin D deficiency, unspecified: Secondary | ICD-10-CM | POA: Diagnosis not present

## 2021-07-13 DIAGNOSIS — M419 Scoliosis, unspecified: Secondary | ICD-10-CM | POA: Diagnosis not present

## 2021-07-13 DIAGNOSIS — M62830 Muscle spasm of back: Secondary | ICD-10-CM | POA: Diagnosis not present

## 2021-07-13 DIAGNOSIS — I10 Essential (primary) hypertension: Secondary | ICD-10-CM | POA: Diagnosis not present

## 2021-07-13 DIAGNOSIS — G500 Trigeminal neuralgia: Secondary | ICD-10-CM | POA: Diagnosis not present

## 2021-07-13 DIAGNOSIS — K219 Gastro-esophageal reflux disease without esophagitis: Secondary | ICD-10-CM | POA: Diagnosis not present

## 2021-07-13 DIAGNOSIS — Z6829 Body mass index (BMI) 29.0-29.9, adult: Secondary | ICD-10-CM | POA: Diagnosis not present

## 2021-07-13 DIAGNOSIS — G510 Bell's palsy: Secondary | ICD-10-CM | POA: Diagnosis not present

## 2021-07-13 LAB — CBC
HCT: 38.8 % (ref 36.0–46.0)
Hemoglobin: 12.8 g/dL (ref 12.0–15.0)
MCH: 30 pg (ref 26.0–34.0)
MCHC: 33 g/dL (ref 30.0–36.0)
MCV: 91.1 fL (ref 80.0–100.0)
Platelets: 279 10*3/uL (ref 150–400)
RBC: 4.26 MIL/uL (ref 3.87–5.11)
RDW: 13.2 % (ref 11.5–15.5)
WBC: 6.1 10*3/uL (ref 4.0–10.5)
nRBC: 0 % (ref 0.0–0.2)

## 2021-07-13 LAB — COMPREHENSIVE METABOLIC PANEL
ALT: 21 U/L (ref 0–44)
AST: 19 U/L (ref 15–41)
Albumin: 4 g/dL (ref 3.5–5.0)
Alkaline Phosphatase: 60 U/L (ref 38–126)
Anion gap: 9 (ref 5–15)
BUN: 20 mg/dL (ref 8–23)
CO2: 25 mmol/L (ref 22–32)
Calcium: 9.1 mg/dL (ref 8.9–10.3)
Chloride: 104 mmol/L (ref 98–111)
Creatinine, Ser: 0.85 mg/dL (ref 0.44–1.00)
GFR, Estimated: >60 mL/min (ref >60)
Glucose, Bld: 97 mg/dL (ref 70–99)
Potassium: 3.6 mmol/L (ref 3.5–5.1)
Sodium: 138 mmol/L (ref 135–145)
Total Bilirubin: 1 mg/dL (ref 0.3–1.2)
Total Protein: 6.7 g/dL (ref 6.5–8.1)

## 2021-07-13 LAB — LIPID PANEL
Cholesterol: 171 mg/dL (ref 0–200)
HDL: 66 mg/dL (ref >40)
LDL Cholesterol: 84 mg/dL (ref 0–99)
Total CHOL/HDL Ratio: 2.6 RATIO
Triglycerides: 105 mg/dL (ref <150)
VLDL: 21 mg/dL (ref 0–40)

## 2021-07-13 LAB — TSH: TSH: 0.665 u[IU]/mL (ref 0.350–4.500)

## 2021-07-13 LAB — VITAMIN D 25 HYDROXY (VIT D DEFICIENCY, FRACTURES): Vit D, 25-Hydroxy: 45.71 ng/mL (ref 30–100)

## 2021-07-14 ENCOUNTER — Other Ambulatory Visit (HOSPITAL_COMMUNITY): Payer: Self-pay

## 2021-07-14 MED ORDER — LORAZEPAM 0.5 MG PO TABS
0.5000 mg | ORAL_TABLET | Freq: Three times a day (TID) | ORAL | 1 refills | Status: DC | PRN
  Filled 2021-07-14: qty 90, 30d supply, fill #0
  Filled 2021-08-11: qty 90, 30d supply, fill #1

## 2021-07-14 MED FILL — Alendronate Sodium Tab 70 MG: ORAL | 28 days supply | Qty: 4 | Fill #7 | Status: AC

## 2021-07-17 ENCOUNTER — Other Ambulatory Visit (HOSPITAL_COMMUNITY): Payer: Self-pay

## 2021-07-24 ENCOUNTER — Other Ambulatory Visit (HOSPITAL_COMMUNITY): Payer: Self-pay

## 2021-07-25 ENCOUNTER — Other Ambulatory Visit (HOSPITAL_COMMUNITY): Payer: Self-pay

## 2021-07-26 ENCOUNTER — Other Ambulatory Visit (HOSPITAL_COMMUNITY): Payer: Self-pay

## 2021-07-26 MED ORDER — HYDROCODONE-ACETAMINOPHEN 5-325 MG PO TABS
1.0000 | ORAL_TABLET | ORAL | 0 refills | Status: DC
  Filled 2021-07-26: qty 90, 8d supply, fill #0

## 2021-08-09 ENCOUNTER — Other Ambulatory Visit (HOSPITAL_COMMUNITY): Payer: Self-pay

## 2021-08-10 ENCOUNTER — Other Ambulatory Visit (HOSPITAL_COMMUNITY): Payer: Self-pay

## 2021-08-11 ENCOUNTER — Other Ambulatory Visit (HOSPITAL_COMMUNITY): Payer: Self-pay

## 2021-08-11 MED FILL — Alendronate Sodium Tab 70 MG: ORAL | 28 days supply | Qty: 4 | Fill #8 | Status: AC

## 2021-08-14 ENCOUNTER — Other Ambulatory Visit (HOSPITAL_COMMUNITY): Payer: Self-pay

## 2021-08-23 ENCOUNTER — Other Ambulatory Visit (HOSPITAL_COMMUNITY): Payer: Self-pay

## 2021-08-24 ENCOUNTER — Other Ambulatory Visit (HOSPITAL_COMMUNITY): Payer: Self-pay

## 2021-08-24 MED ORDER — HYDROCODONE-ACETAMINOPHEN 5-325 MG PO TABS
1.0000 | ORAL_TABLET | ORAL | 0 refills | Status: DC
  Filled 2021-08-24: qty 90, 8d supply, fill #0

## 2021-08-30 DIAGNOSIS — G510 Bell's palsy: Secondary | ICD-10-CM | POA: Diagnosis not present

## 2021-08-30 DIAGNOSIS — G500 Trigeminal neuralgia: Secondary | ICD-10-CM | POA: Diagnosis not present

## 2021-08-30 DIAGNOSIS — R209 Unspecified disturbances of skin sensation: Secondary | ICD-10-CM | POA: Diagnosis not present

## 2021-08-30 DIAGNOSIS — K149 Disease of tongue, unspecified: Secondary | ICD-10-CM | POA: Diagnosis not present

## 2021-09-06 ENCOUNTER — Other Ambulatory Visit (HOSPITAL_COMMUNITY): Payer: Self-pay

## 2021-09-11 ENCOUNTER — Other Ambulatory Visit (HOSPITAL_COMMUNITY): Payer: Self-pay

## 2021-09-13 ENCOUNTER — Other Ambulatory Visit (HOSPITAL_COMMUNITY): Payer: Self-pay

## 2021-09-13 MED ORDER — LORAZEPAM 0.5 MG PO TABS
0.5000 mg | ORAL_TABLET | Freq: Three times a day (TID) | ORAL | 1 refills | Status: DC | PRN
  Filled 2021-09-13: qty 90, 30d supply, fill #0
  Filled 2021-12-26: qty 90, 30d supply, fill #1

## 2021-09-14 ENCOUNTER — Other Ambulatory Visit (HOSPITAL_COMMUNITY): Payer: Self-pay

## 2021-09-26 ENCOUNTER — Other Ambulatory Visit (HOSPITAL_COMMUNITY): Payer: Self-pay

## 2021-09-26 MED ORDER — HYDROCODONE-ACETAMINOPHEN 5-325 MG PO TABS
1.0000 | ORAL_TABLET | ORAL | 0 refills | Status: DC
  Filled 2021-09-26: qty 90, 8d supply, fill #0

## 2021-09-26 MED ORDER — METHOCARBAMOL 750 MG PO TABS
750.0000 mg | ORAL_TABLET | Freq: Three times a day (TID) | ORAL | 2 refills | Status: DC
  Filled 2021-09-26: qty 90, 30d supply, fill #0
  Filled 2021-10-18: qty 90, 30d supply, fill #1
  Filled 2021-11-12: qty 90, 30d supply, fill #2

## 2021-09-27 ENCOUNTER — Other Ambulatory Visit (HOSPITAL_COMMUNITY): Payer: Self-pay

## 2021-09-27 MED ORDER — SCOPOLAMINE 1 MG/3DAYS TD PT72
MEDICATED_PATCH | TRANSDERMAL | 1 refills | Status: AC
  Filled 2021-09-27: qty 4, 12d supply, fill #0
  Filled 2022-06-09: qty 4, 12d supply, fill #1

## 2021-10-12 ENCOUNTER — Other Ambulatory Visit (HOSPITAL_COMMUNITY): Payer: Self-pay

## 2021-10-12 MED ORDER — LORAZEPAM 0.5 MG PO TABS
0.5000 mg | ORAL_TABLET | Freq: Three times a day (TID) | ORAL | 1 refills | Status: DC | PRN
  Filled 2021-10-12: qty 90, 30d supply, fill #0
  Filled 2021-11-27: qty 90, 30d supply, fill #1

## 2021-10-16 ENCOUNTER — Other Ambulatory Visit (HOSPITAL_COMMUNITY): Payer: Self-pay

## 2021-10-17 ENCOUNTER — Other Ambulatory Visit (HOSPITAL_COMMUNITY): Payer: Self-pay

## 2021-10-17 MED ORDER — LIDOCAINE VISCOUS HCL 2 % MT SOLN
OROMUCOSAL | 1 refills | Status: DC
  Filled 2021-10-17: qty 100, 7d supply, fill #0
  Filled 2021-11-27: qty 100, 7d supply, fill #1

## 2021-10-18 ENCOUNTER — Other Ambulatory Visit (HOSPITAL_COMMUNITY): Payer: Self-pay

## 2021-11-01 ENCOUNTER — Other Ambulatory Visit (HOSPITAL_COMMUNITY): Payer: Self-pay

## 2021-11-01 MED ORDER — HYDROCODONE-ACETAMINOPHEN 5-325 MG PO TABS
1.0000 | ORAL_TABLET | ORAL | 0 refills | Status: DC
  Filled 2021-11-01: qty 90, 8d supply, fill #0

## 2021-11-03 ENCOUNTER — Other Ambulatory Visit (HOSPITAL_COMMUNITY): Payer: Self-pay

## 2021-11-13 ENCOUNTER — Other Ambulatory Visit (HOSPITAL_COMMUNITY): Payer: Self-pay

## 2021-11-14 ENCOUNTER — Other Ambulatory Visit (HOSPITAL_COMMUNITY): Payer: Self-pay

## 2021-11-22 ENCOUNTER — Other Ambulatory Visit (HOSPITAL_COMMUNITY): Payer: Self-pay

## 2021-11-27 ENCOUNTER — Other Ambulatory Visit (HOSPITAL_COMMUNITY): Payer: Self-pay

## 2021-11-27 MED ORDER — ZOLPIDEM TARTRATE 10 MG PO TABS
10.0000 mg | ORAL_TABLET | Freq: Every day | ORAL | 1 refills | Status: DC
  Filled 2021-11-27 – 2021-12-04 (×3): qty 90, 90d supply, fill #0
  Filled 2022-04-12: qty 90, 90d supply, fill #1

## 2021-12-02 ENCOUNTER — Other Ambulatory Visit (HOSPITAL_COMMUNITY): Payer: Self-pay

## 2021-12-04 ENCOUNTER — Other Ambulatory Visit (HOSPITAL_COMMUNITY): Payer: Self-pay

## 2021-12-04 MED ORDER — HYDROCODONE-ACETAMINOPHEN 5-325 MG PO TABS
1.0000 | ORAL_TABLET | ORAL | 0 refills | Status: DC
  Filled 2021-12-04: qty 90, 15d supply, fill #0

## 2021-12-05 ENCOUNTER — Other Ambulatory Visit (HOSPITAL_COMMUNITY): Payer: Self-pay

## 2021-12-08 ENCOUNTER — Other Ambulatory Visit (HOSPITAL_COMMUNITY): Payer: Self-pay

## 2021-12-18 ENCOUNTER — Other Ambulatory Visit (HOSPITAL_COMMUNITY): Payer: Self-pay

## 2021-12-18 MED ORDER — METHOCARBAMOL 750 MG PO TABS
750.0000 mg | ORAL_TABLET | Freq: Three times a day (TID) | ORAL | 1 refills | Status: DC
  Filled 2021-12-18: qty 90, 30d supply, fill #0
  Filled 2022-01-17: qty 90, 30d supply, fill #1

## 2021-12-18 MED ORDER — FAMOTIDINE 40 MG PO TABS
40.0000 mg | ORAL_TABLET | Freq: Every day | ORAL | 3 refills | Status: DC
  Filled 2021-12-18: qty 90, 90d supply, fill #0
  Filled 2022-03-13: qty 90, 90d supply, fill #1
  Filled 2022-06-09: qty 90, 90d supply, fill #2
  Filled 2022-09-24: qty 90, 90d supply, fill #3

## 2021-12-19 ENCOUNTER — Other Ambulatory Visit (HOSPITAL_COMMUNITY): Payer: Self-pay

## 2021-12-19 MED ORDER — PREGABALIN 150 MG PO CAPS
150.0000 mg | ORAL_CAPSULE | Freq: Two times a day (BID) | ORAL | 1 refills | Status: DC
  Filled 2021-12-19: qty 180, 90d supply, fill #0
  Filled 2022-03-13: qty 180, 90d supply, fill #1

## 2021-12-26 ENCOUNTER — Other Ambulatory Visit (HOSPITAL_COMMUNITY): Payer: Self-pay

## 2021-12-28 ENCOUNTER — Other Ambulatory Visit (HOSPITAL_COMMUNITY): Payer: Self-pay

## 2021-12-29 ENCOUNTER — Other Ambulatory Visit (HOSPITAL_COMMUNITY): Payer: Self-pay

## 2021-12-29 MED ORDER — LIDOCAINE VISCOUS HCL 2 % MT SOLN
OROMUCOSAL | 1 refills | Status: DC
  Filled 2021-12-29: qty 100, fill #0
  Filled 2022-06-09: qty 100, 7d supply, fill #0
  Filled 2022-09-24: qty 100, 7d supply, fill #1

## 2022-01-01 ENCOUNTER — Other Ambulatory Visit (HOSPITAL_COMMUNITY): Payer: Self-pay

## 2022-01-01 MED ORDER — OMEPRAZOLE 40 MG PO CPDR
40.0000 mg | DELAYED_RELEASE_CAPSULE | Freq: Two times a day (BID) | ORAL | 4 refills | Status: DC
  Filled 2022-01-01 – 2022-01-05 (×2): qty 180, 90d supply, fill #0
  Filled 2022-04-12: qty 180, 90d supply, fill #1

## 2022-01-01 MED ORDER — SUCRALFATE 1 G PO TABS
ORAL_TABLET | ORAL | 3 refills | Status: DC
  Filled 2022-01-01: qty 90, 30d supply, fill #0
  Filled 2022-02-06: qty 90, 30d supply, fill #1
  Filled 2022-03-13: qty 90, 30d supply, fill #2
  Filled 2022-05-03: qty 90, 30d supply, fill #3

## 2022-01-05 ENCOUNTER — Other Ambulatory Visit (HOSPITAL_COMMUNITY): Payer: Self-pay

## 2022-01-09 ENCOUNTER — Other Ambulatory Visit (HOSPITAL_COMMUNITY): Payer: Self-pay

## 2022-01-09 MED ORDER — HYDROCODONE-ACETAMINOPHEN 5-325 MG PO TABS
1.0000 | ORAL_TABLET | ORAL | 0 refills | Status: DC
  Filled 2022-01-09: qty 90, 15d supply, fill #0

## 2022-01-11 DIAGNOSIS — M62830 Muscle spasm of back: Secondary | ICD-10-CM | POA: Diagnosis not present

## 2022-01-11 DIAGNOSIS — R1310 Dysphagia, unspecified: Secondary | ICD-10-CM | POA: Diagnosis not present

## 2022-01-11 DIAGNOSIS — I10 Essential (primary) hypertension: Secondary | ICD-10-CM | POA: Diagnosis not present

## 2022-01-17 ENCOUNTER — Other Ambulatory Visit (HOSPITAL_COMMUNITY): Payer: Self-pay

## 2022-01-18 ENCOUNTER — Other Ambulatory Visit (HOSPITAL_COMMUNITY)
Admission: RE | Admit: 2022-01-18 | Discharge: 2022-01-18 | Disposition: A | Discharge status: Home / Self Care | Payer: 59 | Source: Ambulatory Visit | Attending: General Practice | Admitting: General Practice

## 2022-01-18 DIAGNOSIS — I10 Essential (primary) hypertension: Secondary | ICD-10-CM | POA: Diagnosis not present

## 2022-01-18 LAB — CBC
HCT: 40.2 % (ref 36.0–46.0)
Hemoglobin: 13 g/dL (ref 12.0–15.0)
MCH: 29.8 pg (ref 26.0–34.0)
MCHC: 32.3 g/dL (ref 30.0–36.0)
MCV: 92.2 fL (ref 80.0–100.0)
Platelets: 269 10*3/uL (ref 150–400)
RBC: 4.36 MIL/uL (ref 3.87–5.11)
RDW: 12.6 % (ref 11.5–15.5)
WBC: 8.8 10*3/uL (ref 4.0–10.5)
nRBC: 0 % (ref 0.0–0.2)

## 2022-01-18 LAB — COMPREHENSIVE METABOLIC PANEL
ALT: 18 U/L (ref 0–44)
AST: 14 U/L — ABNORMAL LOW (ref 15–41)
Albumin: 3.9 g/dL (ref 3.5–5.0)
Alkaline Phosphatase: 62 U/L (ref 38–126)
Anion gap: 7 (ref 5–15)
BUN: 29 mg/dL — ABNORMAL HIGH (ref 8–23)
CO2: 27 mmol/L (ref 22–32)
Calcium: 9.5 mg/dL (ref 8.9–10.3)
Chloride: 105 mmol/L (ref 98–111)
Creatinine, Ser: 0.88 mg/dL (ref 0.44–1.00)
GFR, Estimated: >60 mL/min (ref >60)
Glucose, Bld: 95 mg/dL (ref 70–99)
Potassium: 3.6 mmol/L (ref 3.5–5.1)
Sodium: 139 mmol/L (ref 135–145)
Total Bilirubin: 0.8 mg/dL (ref 0.3–1.2)
Total Protein: 6.6 g/dL (ref 6.5–8.1)

## 2022-01-18 LAB — LIPID PANEL
Cholesterol: 138 mg/dL (ref 0–200)
HDL: 55 mg/dL (ref >40)
LDL Cholesterol: 66 mg/dL (ref 0–99)
Total CHOL/HDL Ratio: 2.5 RATIO
Triglycerides: 87 mg/dL (ref <150)
VLDL: 17 mg/dL (ref 0–40)

## 2022-01-24 ENCOUNTER — Other Ambulatory Visit (HOSPITAL_COMMUNITY): Payer: Self-pay

## 2022-01-26 ENCOUNTER — Other Ambulatory Visit (HOSPITAL_COMMUNITY): Payer: Self-pay

## 2022-01-26 MED ORDER — LORAZEPAM 0.5 MG PO TABS
0.5000 mg | ORAL_TABLET | Freq: Three times a day (TID) | ORAL | 1 refills | Status: DC | PRN
  Filled 2022-01-26: qty 90, 30d supply, fill #0
  Filled 2022-03-02: qty 90, 30d supply, fill #1

## 2022-02-07 ENCOUNTER — Other Ambulatory Visit (HOSPITAL_COMMUNITY): Payer: Self-pay

## 2022-02-08 ENCOUNTER — Encounter: Payer: Self-pay | Admitting: *Deleted

## 2022-02-09 ENCOUNTER — Other Ambulatory Visit (HOSPITAL_COMMUNITY): Payer: Self-pay

## 2022-02-09 MED ORDER — HYDROCODONE-ACETAMINOPHEN 5-325 MG PO TABS
1.0000 | ORAL_TABLET | ORAL | 0 refills | Status: DC
  Filled 2022-02-09: qty 90, 8d supply, fill #0

## 2022-02-15 ENCOUNTER — Other Ambulatory Visit (HOSPITAL_COMMUNITY): Payer: Self-pay

## 2022-02-16 ENCOUNTER — Ambulatory Visit: Payer: 59 | Admitting: Physician Assistant

## 2022-02-16 ENCOUNTER — Encounter: Payer: Self-pay | Admitting: Physician Assistant

## 2022-02-16 ENCOUNTER — Other Ambulatory Visit (HOSPITAL_COMMUNITY): Payer: Self-pay

## 2022-02-16 VITALS — BP 118/90 | HR 78 | Ht 65.0 in | Wt 169.0 lb

## 2022-02-16 DIAGNOSIS — Z9884 Bariatric surgery status: Secondary | ICD-10-CM

## 2022-02-16 DIAGNOSIS — Z1211 Encounter for screening for malignant neoplasm of colon: Secondary | ICD-10-CM | POA: Diagnosis not present

## 2022-02-16 DIAGNOSIS — K219 Gastro-esophageal reflux disease without esophagitis: Secondary | ICD-10-CM | POA: Diagnosis not present

## 2022-02-16 DIAGNOSIS — R1310 Dysphagia, unspecified: Secondary | ICD-10-CM | POA: Diagnosis not present

## 2022-02-16 MED ORDER — METHOCARBAMOL 750 MG PO TABS
750.0000 mg | ORAL_TABLET | Freq: Three times a day (TID) | ORAL | 1 refills | Status: DC
  Filled 2022-02-16: qty 90, 30d supply, fill #0
  Filled 2022-03-15: qty 90, 30d supply, fill #1

## 2022-03-02 ENCOUNTER — Other Ambulatory Visit (HOSPITAL_COMMUNITY): Payer: Self-pay

## 2022-03-03 ENCOUNTER — Other Ambulatory Visit (HOSPITAL_COMMUNITY): Payer: Self-pay

## 2022-03-12 ENCOUNTER — Other Ambulatory Visit (HOSPITAL_COMMUNITY): Payer: Self-pay

## 2022-03-12 MED ORDER — HYDROCODONE-ACETAMINOPHEN 5-325 MG PO TABS
1.0000 | ORAL_TABLET | ORAL | 0 refills | Status: DC
  Filled 2022-03-12: qty 90, 8d supply, fill #0

## 2022-03-13 ENCOUNTER — Other Ambulatory Visit (HOSPITAL_COMMUNITY): Payer: Self-pay

## 2022-03-16 ENCOUNTER — Other Ambulatory Visit (HOSPITAL_COMMUNITY): Payer: Self-pay

## 2022-03-20 ENCOUNTER — Other Ambulatory Visit (HOSPITAL_COMMUNITY): Payer: Self-pay

## 2022-03-22 ENCOUNTER — Other Ambulatory Visit (HOSPITAL_COMMUNITY): Payer: Self-pay

## 2022-04-02 ENCOUNTER — Other Ambulatory Visit (HOSPITAL_COMMUNITY): Payer: Self-pay

## 2022-04-03 ENCOUNTER — Other Ambulatory Visit (HOSPITAL_COMMUNITY): Payer: Self-pay

## 2022-04-03 MED ORDER — LORAZEPAM 0.5 MG PO TABS
0.5000 mg | ORAL_TABLET | Freq: Three times a day (TID) | ORAL | 1 refills | Status: DC | PRN
  Filled 2022-04-03: qty 90, 30d supply, fill #0
  Filled 2022-05-03: qty 90, 30d supply, fill #1

## 2022-04-04 ENCOUNTER — Telehealth: Payer: Self-pay | Admitting: *Deleted

## 2022-04-04 NOTE — Telephone Encounter (Signed)
Pt rescheduled with Dr. Havery Moros 05-07-22 at 2:00 pm.  New instructions sent via [facility]

## 2022-04-12 ENCOUNTER — Other Ambulatory Visit (HOSPITAL_COMMUNITY): Payer: Self-pay

## 2022-04-13 ENCOUNTER — Other Ambulatory Visit (HOSPITAL_COMMUNITY): Payer: Self-pay

## 2022-04-13 MED ORDER — HYDROCODONE-ACETAMINOPHEN 5-325 MG PO TABS
1.0000 | ORAL_TABLET | ORAL | 0 refills | Status: DC
  Filled 2022-04-13: qty 90, 12d supply, fill #0

## 2022-04-16 ENCOUNTER — Encounter: Payer: 59 | Admitting: Gastroenterology

## 2022-04-18 ENCOUNTER — Other Ambulatory Visit (HOSPITAL_COMMUNITY): Payer: Self-pay

## 2022-04-19 ENCOUNTER — Other Ambulatory Visit (HOSPITAL_COMMUNITY): Payer: Self-pay

## 2022-04-19 MED ORDER — METHOCARBAMOL 750 MG PO TABS
750.0000 mg | ORAL_TABLET | Freq: Three times a day (TID) | ORAL | 1 refills | Status: DC
  Filled 2022-04-19: qty 90, 30d supply, fill #0
  Filled 2022-05-21: qty 90, 30d supply, fill #1

## 2022-04-20 ENCOUNTER — Other Ambulatory Visit (HOSPITAL_COMMUNITY): Payer: Self-pay

## 2022-04-29 ENCOUNTER — Encounter: Payer: Self-pay | Admitting: Certified Registered Nurse Anesthetist

## 2022-05-03 ENCOUNTER — Other Ambulatory Visit (HOSPITAL_COMMUNITY): Payer: Self-pay

## 2022-05-03 MED ORDER — LOSARTAN POTASSIUM 100 MG PO TABS
100.0000 mg | ORAL_TABLET | Freq: Every day | ORAL | 3 refills | Status: DC
  Filled 2022-05-03: qty 90, 90d supply, fill #0
  Filled 2022-06-09 – 2022-10-12 (×2): qty 90, 90d supply, fill #1
  Filled 2023-01-29: qty 90, 90d supply, fill #2
  Filled 2023-05-03: qty 90, 90d supply, fill #3

## 2022-05-04 ENCOUNTER — Other Ambulatory Visit (HOSPITAL_COMMUNITY): Payer: Self-pay

## 2022-05-07 ENCOUNTER — Encounter: Payer: Self-pay | Admitting: Gastroenterology

## 2022-05-07 ENCOUNTER — Ambulatory Visit (AMBULATORY_SURGERY_CENTER): Payer: 59 | Admitting: Gastroenterology

## 2022-05-07 ENCOUNTER — Other Ambulatory Visit (HOSPITAL_COMMUNITY): Payer: Self-pay

## 2022-05-07 VITALS — BP 125/86 | HR 70 | Temp 98.0°F | Resp 12 | Ht 65.0 in | Wt 169.0 lb

## 2022-05-07 DIAGNOSIS — Z1211 Encounter for screening for malignant neoplasm of colon: Secondary | ICD-10-CM

## 2022-05-07 DIAGNOSIS — K200 Eosinophilic esophagitis: Secondary | ICD-10-CM | POA: Diagnosis not present

## 2022-05-07 DIAGNOSIS — K449 Diaphragmatic hernia without obstruction or gangrene: Secondary | ICD-10-CM

## 2022-05-07 DIAGNOSIS — K219 Gastro-esophageal reflux disease without esophagitis: Secondary | ICD-10-CM

## 2022-05-07 DIAGNOSIS — D122 Benign neoplasm of ascending colon: Secondary | ICD-10-CM

## 2022-05-07 DIAGNOSIS — R1310 Dysphagia, unspecified: Secondary | ICD-10-CM

## 2022-05-07 DIAGNOSIS — K635 Polyp of colon: Secondary | ICD-10-CM | POA: Diagnosis not present

## 2022-05-07 DIAGNOSIS — I10 Essential (primary) hypertension: Secondary | ICD-10-CM | POA: Diagnosis not present

## 2022-05-07 DIAGNOSIS — D124 Benign neoplasm of descending colon: Secondary | ICD-10-CM

## 2022-05-07 DIAGNOSIS — K209 Esophagitis, unspecified without bleeding: Secondary | ICD-10-CM | POA: Diagnosis not present

## 2022-05-07 MED ORDER — METOCLOPRAMIDE HCL 5 MG PO TABS
5.0000 mg | ORAL_TABLET | Freq: Every evening | ORAL | 2 refills | Status: DC
  Filled 2022-05-07: qty 30, 30d supply, fill #0
  Filled 2022-06-05: qty 30, 30d supply, fill #1
  Filled 2022-06-09 – 2022-06-14 (×2): qty 30, 30d supply, fill #2

## 2022-05-07 MED ORDER — DEXLANSOPRAZOLE 60 MG PO CPDR
60.0000 mg | DELAYED_RELEASE_CAPSULE | Freq: Every day | ORAL | 3 refills | Status: DC
  Filled 2022-05-07: qty 90, 90d supply, fill #0

## 2022-05-07 MED ORDER — SODIUM CHLORIDE 0.9 % IV SOLN: 500.0000 mL | Freq: Once | INTRAVENOUS | Status: DC

## 2022-05-07 NOTE — Progress Notes (Signed)
1330 Robinul 0.1 mg IV given due large amount of secretions upon assessment.  MD made aware, vss  °

## 2022-05-07 NOTE — Patient Instructions (Addendum)
Handouts on colon polyps & hemorrhoids given to you   Await pathology results on polyp removed     Handout on Esophagitis given to you  Switch Omeprazole Dexilant 60 mg daily if covered by insurance  Await biopsy results    YOU HAD AN ENDOSCOPIC PROCEDURE TODAY AT [facility]:   Refer to the procedure report that was given to you for any specific questions about what was found during the examination.  If the procedure report does not answer your questions, please call your gastroenterologist to clarify.  If you requested that your care partner not be given the details of your procedure findings, then the procedure report has been included in a sealed envelope for you to review at your convenience later.  YOU SHOULD EXPECT: Some feelings of bloating in the abdomen. Passage of more gas than usual.  Walking can help get rid of the air that was put into your GI tract during the procedure and reduce the bloating. If you had a lower endoscopy (such as a colonoscopy or flexible sigmoidoscopy) you may notice spotting of blood in your stool or on the toilet paper. If you underwent a bowel prep for your procedure, you may not have a normal bowel movement for a few days.  Please Note:  You might notice some irritation and congestion in your nose or some drainage.  This is from the oxygen used during your procedure.  There is no need for concern and it should clear up in a day or so.  SYMPTOMS TO REPORT IMMEDIATELY:  Following lower endoscopy (colonoscopy or flexible sigmoidoscopy):  Excessive amounts of blood in the stool  Significant tenderness or worsening of abdominal pains  Swelling of the abdomen that is new, acute  Fever of 100F or higher  Following upper endoscopy (EGD)  Vomiting of blood or coffee ground material  New chest pain or pain under the shoulder blades  Painful or persistently difficult swallowing  New shortness of breath  Fever of 100F or higher  Black,  tarry-looking stools  For urgent or emergent issues, a gastroenterologist can be reached at any hour by calling [phone]. Do not use MyChart messaging for urgent concerns.    DIET:  We do recommend a small meal at first, but then you may proceed to your regular diet.  Drink plenty of fluids but you should avoid alcoholic beverages for 24 hours.  ACTIVITY:  You should plan to take it easy for the rest of today and you should NOT DRIVE or use heavy machinery until tomorrow (because of the sedation medicines used during the test).    FOLLOW UP: Our staff will call the number listed on your records the next business day following your procedure.  We will call around 7:15- 8:00 am to check on you and address any questions or concerns that you may have regarding the information given to you following your procedure. If we do not reach you, we will leave a message.     If any biopsies were taken you will be contacted by phone or by letter within the next 1-3 weeks.  Please call us at [phone] if you have not heard about the biopsies in 3 weeks.    SIGNATURES/CONFIDENTIALITY: You and/or your care partner have signed paperwork which will be entered into your electronic medical record.  These signatures attest to the fact that that the information above on your After Visit Summary has been reviewed and is understood.  Full responsibility of the confidentiality of this discharge information lies with you and/or your care-partner.  

## 2022-05-07 NOTE — Progress Notes (Signed)
Report given to PACU, vss 

## 2022-05-07 NOTE — Op Note (Signed)
[place] Patient Name: Stephanie Silva Procedure Date: 05/07/2022 1:23 PM MRN: [id number] Endoscopist: Remo Lipps P. Havery Moros , MD Age: 61 Referring MD:  Date of Birth: 1961/08/06 Gender: Female Account #: [id number] Procedure:                Colonoscopy Indications:              Screening for colorectal malignant neoplasm Medicines:                Monitored Anesthesia Care Procedure:                Pre-Anesthesia Assessment:                           - Prior to the procedure, a History and Physical                            was performed, and patient medications and                            allergies were reviewed. The patient's tolerance of                            previous anesthesia was also reviewed. The risks                            and benefits of the procedure and the sedation                            options and risks were discussed with the patient.                            All questions were answered, and informed consent                            was obtained. Prior Anticoagulants: The patient has                            taken no previous anticoagulant or antiplatelet                            agents. ASA Grade Assessment: II - A patient with                            mild systemic disease. After reviewing the risks                            and benefits, the patient was deemed in                            satisfactory condition to undergo the procedure.                           After obtaining informed consent, the colonoscope  was passed under direct vision. Throughout the                            procedure, the patient's blood pressure, pulse, and                            oxygen saturations were monitored continuously. The                            Olympus PCF-H190DL (#[id number]) Colonoscope was                            introduced through the anus and advanced to the the                            cecum,  identified by appendiceal orifice and                            ileocecal valve. The colonoscopy was performed                            without difficulty. The patient tolerated the                            procedure well. The quality of the bowel                            preparation was adequate. The ileocecal valve,                            appendiceal orifice, and rectum were photographed. Scope In: 1:49:47 PM Scope Out: 2:06:22 PM Scope Withdrawal Time: 0 hours 12 minutes 57 seconds  Total Procedure Duration: 0 hours 16 minutes 35 seconds  Findings:                 Hemorrhoids were found on perianal exam.                           A 3 mm polyp was found in the ascending colon. The                            polyp was sessile. The polyp was removed with a                            cold snare. Resection and retrieval were complete.                           The colon was tortuous.                           Internal hemorrhoids were found.                           The exam was otherwise without abnormality.  Retroflexed views of the rectum not possible due to                            small size of rectum and poor air retention of left                            colon. Complications:            No immediate complications. Estimated blood loss:                            Minimal. Estimated Blood Loss:     Estimated blood loss was minimal. Impression:               - Hemorrhoids found on perianal exam.                           - One 3 mm polyp in the ascending colon, removed                            with a cold snare. Resected and retrieved.                           - Tortuous colon.                           - Internal hemorrhoids.                           - The examination was otherwise normal. Recommendation:           - Patient has a contact number available for                            emergencies. The signs and symptoms of potential                             delayed complications were discussed with the                            patient. Return to normal activities tomorrow.                            Written discharge instructions were provided to the                            patient.                           - Resume previous diet.                           - Continue present medications.                           - Await pathology results. Remo Lipps P. Nazire Fruth, MD 05/07/2022 2:22:24 PM This report has been signed electronically.

## 2022-05-07 NOTE — Progress Notes (Signed)
DEXILANT 60 MG DAILY #90 WITH 3 REFILLS ORDERED AS PROCEDURE REPORT OREDERED BY DR ARMBRUSTER- PER DR ARMBRUSTER -DO NOT D/C OMEPRAZOLE IN CASE DEXILANT NOT COVERED  REGLAN '5MG'$  QHS #30 WITH 2 REFILLS V/O DR Havery Moros /P Stephaney Steven,RN ORDERED

## 2022-05-07 NOTE — Progress Notes (Signed)
Called to room to assist during endoscopic procedure.  Patient ID and intended procedure confirmed with present staff. Received instructions for my participation in the procedure from the performing physician.  

## 2022-05-07 NOTE — Progress Notes (Signed)
Pt's states no medical or surgical changes since previsit or office visit. 

## 2022-05-07 NOTE — Progress Notes (Signed)
[facility] History and Physical   Primary Care Physician:  Redmond Pulling, MD   Reason for Procedure:   GERD, dysphagia, colon cancer screening  Plan:    EGD and colonoscopy     HPI: Stephanie Silva is a 61 y.o. female  here for EGD and colonoscopy to evaluate the issues above. On PPI, BID pepcid, carafate, will with some breakthrough reflux symptoms. Last colonoscopy > 10 years ago. No family history of colon cancer known. Otherwise feels well without any cardiopulmonary symptoms.   I have discussed risks / benefits of anesthesia and endoscopic procedure with Stephanie Silva and they wish to proceed with the exams as outlined today.    Past Medical History:  Diagnosis Date   Bell's palsy    Benign essential hypertension    Chronic disease anemia    GERD (gastroesophageal reflux disease)    H. pylori infection    Idiopathic scoliosis    Insomnia    Migraine    Osteoporosis    Vitamin D deficiency     Past Surgical History:  Procedure Laterality Date   GASTRIC BYPASS     NOSE SURGERY     VAGINAL HYSTERECTOMY      Prior to Admission medications   Medication Sig Start Date End Date Taking? Authorizing Provider  chlorthalidone (HYGROTON) 25 MG tablet TAKE 1 TABLET BY MOUTH ONCE A DAY 06/09/21  Yes   HYDROcodone-acetaminophen (NORCO/VICODIN) 5-325 MG tablet Take 1 to 2 tablets by mouth every 4 to 6 hours as needed for pain 04/12/22  Yes   loratadine (CLARITIN) 10 MG tablet Take by mouth.   Yes [provider]  LORazepam (ATIVAN) 0.5 MG tablet TAKE 1 TABLET BY MOUTH 3 TIMES A DAY AS NEEDED 04/03/22  Yes   losartan (COZAAR) 100 MG tablet Take 1 tablet (100 mg total) by mouth daily. 05/03/22  Yes   methocarbamol (ROBAXIN) 750 MG tablet Take 1 tablet by mouth 3 times a day 04/19/22  Yes   omeprazole (PRILOSEC) 40 MG capsule Take 1 capsule by mouth twice a day 01/01/22  Yes   pregabalin (LYRICA) 150 MG capsule TAKE 1 CAPSULE BY MOUTH TWICE DAILY 12/18/21  Yes    sucralfate (CARAFATE) 1 g tablet TAKE 1 TABLET BY MOUTH 30 MINUTES TO 1 HOUR  PRIOR TO MEALS 01/01/22  Yes   zolpidem (AMBIEN) 10 MG tablet TAKE 1 TABLET BY MOUTH AT BEDTIME 11/27/21  Yes   famotidine (PEPCID) 40 MG tablet Take 1 tablet by mouth once a day 12/18/21     lidocaine (XYLOCAINE) 2 % solution TAKE 5 MLS by mouth 3 TIMES daily as needed  (MIX WITH 25 MLS OF LIQUID ANTACID OTC) 12/29/21     ondansetron (ZOFRAN-ODT) 8 MG disintegrating tablet Dissolve 1 tablet by mouth every 8 hours as needed for nausea 01/10/21     scopolamine (TRANSDERM-SCOP) 1 MG/3DAYS APPLY 1 TRANSDERMAL PATCH TO SKIN EVERY THREE DAYS 09/26/21     SUMAtriptan (IMITREX) 100 MG tablet Take 1 tablet by mouth as needed 05/06/21       Current Outpatient Medications  Medication Sig Dispense Refill   chlorthalidone (HYGROTON) 25 MG tablet TAKE 1 TABLET BY MOUTH ONCE A DAY 90 tablet 3   HYDROcodone-acetaminophen (NORCO/VICODIN) 5-325 MG tablet Take 1 to 2 tablets by mouth every 4 to 6 hours as needed for pain 90 tablet 0   loratadine (CLARITIN) 10 MG tablet Take by mouth.     LORazepam (ATIVAN) 0.5 MG tablet TAKE  1 TABLET BY MOUTH 3 TIMES A DAY AS NEEDED 90 tablet 1   losartan (COZAAR) 100 MG tablet Take 1 tablet (100 mg total) by mouth daily. 90 tablet 3   methocarbamol (ROBAXIN) 750 MG tablet Take 1 tablet by mouth 3 times a day 90 tablet 1   omeprazole (PRILOSEC) 40 MG capsule Take 1 capsule by mouth twice a day 180 capsule 4   pregabalin (LYRICA) 150 MG capsule TAKE 1 CAPSULE BY MOUTH TWICE DAILY 180 capsule 1   sucralfate (CARAFATE) 1 g tablet TAKE 1 TABLET BY MOUTH 30 MINUTES TO 1 HOUR  PRIOR TO MEALS 90 tablet 3   zolpidem (AMBIEN) 10 MG tablet TAKE 1 TABLET BY MOUTH AT BEDTIME 90 tablet 1   famotidine (PEPCID) 40 MG tablet Take 1 tablet by mouth once a day 90 tablet 3   lidocaine (XYLOCAINE) 2 % solution TAKE 5 MLS by mouth 3 TIMES daily as needed  (MIX WITH 25 MLS OF LIQUID ANTACID OTC) 100 mL 1   ondansetron (ZOFRAN-ODT)  8 MG disintegrating tablet Dissolve 1 tablet by mouth every 8 hours as needed for nausea 30 tablet 1   scopolamine (TRANSDERM-SCOP) 1 MG/3DAYS APPLY 1 TRANSDERMAL PATCH TO SKIN EVERY THREE DAYS 4 patch 1   SUMAtriptan (IMITREX) 100 MG tablet Take 1 tablet by mouth as needed 27 tablet 3   Current Facility-Administered Medications  Medication Dose Route Frequency Provider Last Rate Last Admin   0.9 %  sodium chloride infusion  500 mL Intravenous Once Jamicia Haaland, Carlota Raspberry, MD        Allergies as of 05/07/2022 - Review Complete 05/07/2022  Allergen Reaction Noted   Demerol [meperidine hcl] Nausea And Vomiting 02/16/2022   Meperidine Nausea Only 01/11/2021   Nitrofurantoin  05/07/2022   Polyethyl glyc-propyl glyc pf  05/07/2022    Family History  Problem Relation Age of Onset   Hypothyroidism Mother    Other Father        brain tumor   COPD Maternal Grandmother    Asthma Maternal Grandmother    Heart failure Maternal Grandfather    Diabetes Cousin    Colon cancer Neg Hx     Social History   Socioeconomic History   Marital status: Married    Spouse name: Not on file   Number of children: 2   Years of education: Not on file   Highest education level: Not on file  Occupational History   Not on file  Tobacco Use   Smoking status: Never   Smokeless tobacco: Never  Vaping Use   Vaping Use: Never used  Substance and Sexual Activity   Alcohol use: Never    Comment: occasionaly   Drug use: Never   Sexual activity: Not on file  Other Topics Concern   Not on file  Social History Narrative   OR RN   Social Determinants of Health   Financial Resource Strain: Not on file  Food Insecurity: Not on file  Transportation Needs: Not on file  Physical Activity: Not on file  Stress: Not on file  Social Connections: Not on file  Intimate Partner Violence: Not on file    Review of Systems: All other review of systems negative except as mentioned in the HPI.  Physical  Exam: Vital signs BP (!) 131/93   Pulse 86   Temp 98 F (36.7 C)   Resp 11   Ht '5\' 5"'$  (1.651 m)   Wt 169 lb (76.7 kg)   LMP  (  LMP Unknown)   SpO2 100%   BMI 28.12 kg/m   General:   Alert,  Well-developed, pleasant and cooperative in NAD Lungs:  Clear throughout to auscultation.   Heart:  Regular rate and rhythm Abdomen:  Soft, nontender and nondistended.   Neuro/Psych:  Alert and cooperative. Normal mood and affect. A and O x 3  Jolly Mango, MD [facility]

## 2022-05-07 NOTE — Op Note (Addendum)
[facility] Patient Name: Stephanie Silva Procedure Date: 05/07/2022 1:24 PM MRN: [id number] Endoscopist: Remo Lipps P. Havery Moros , MD Age: 61 Referring MD:  Date of Birth: Aug 06, 1961 Gender: Female Account #: [id number] Procedure:                Upper GI endoscopy Indications:              Dysphagia, persistent gastro-esophageal reflux                            disease - on omeprazole twice daily, pepcid, and                            carafate - persistent symptoms, history of "mini                            gastric bypass" Medicines:                Monitored Anesthesia Care Procedure:                Pre-Anesthesia Assessment:                           - Prior to the procedure, a History and Physical                            was performed, and patient medications and                            allergies were reviewed. The patient's tolerance of                            previous anesthesia was also reviewed. The risks                            and benefits of the procedure and the sedation                            options and risks were discussed with the patient.                            All questions were answered, and informed consent                            was obtained. Prior Anticoagulants: The patient has                            taken no previous anticoagulant or antiplatelet                            agents. ASA Grade Assessment: II - A patient with                            mild systemic disease. After reviewing the risks  and benefits, the patient was deemed in                            satisfactory condition to undergo the procedure.                           After obtaining informed consent, the endoscope was                            passed under direct vision. Throughout the                            procedure, the patient's blood pressure, pulse, and                            oxygen saturations were monitored  continuously. The                            GIF [id number] #[id number] was introduced through the                            mouth, and advanced to the proximal jejunum. The                            upper GI endoscopy was accomplished without                            difficulty. The patient tolerated the procedure                            well. Scope In: Scope Out: Findings:                 The Z-line was slightly irregular but did not meet                            criteria for Barrett's, and was found 28 cm from                            the incisors.                           The lower third of the esophagus was tortuous.                            Angulated turn to enter the gastric pouch                           Moderate esophagitis was found in the distal                            esophagus.                           The exam of the esophagus was otherwise normal. No  stenosis / stricture appreciated.                           Biopsies were taken with a cold forceps in the                            middle third of the esophagus and in the lower                            third of the esophagus for histology.                           Evidence of a gastric bypass was found. There was                            evidence of a sleeve gastrectomy in the proximal                            gastric body, and surgical anastomosis at the end                            of the pouch, which led into 2 small bowel limbs.                            The surgical anastomosis was patent and no                            ulcerations. The proximal pouch was dilated - not                            sure if this represents a hiatal hernia, it was                            difficult to assess for hiatal hernia given the                            altered anatomy.                           The exam of the stomach was otherwise normal.                           The examined  small bowe limbs were normal. Complications:            No immediate complications. Estimated blood loss:                            Minimal. Estimated Blood Loss:     Estimated blood loss was minimal. Impression:               - Z-line slightly irregular, 28 cm from the                            incisors. Did not meet criteria  for Barrett's                           - Tortuous esophagus.                           - Esophagitis.                           - Biopsies of the esophagus taken to rule out EoE.                           - Post surgical "mini" gastric bypass anatomy.                            Anastomosis looks good. Anatomy makes it difficult                            to assess for hiatal hernia.                           - Normal small bowel limbs                           Unfortunately significant symptoms despite max                            medical therapy and evidence of esophagitis. This                            may require surgical treatment to relieve symptoms.                            Need to rule out motility disorder prior to                            surgical evaluation. Recommendation:           - Patient has a contact number available for                            emergencies. The signs and symptoms of potential                            delayed complications were discussed with the                            patient. Return to normal activities tomorrow.                            Written discharge instructions were provided to the                            patient.                           - Resume previous diet.                           -  Continue present medications.                           - Switch omeprazole to dexilant '60mg'$  / day if                            covered by insurance                           - Trial of Reglan '5mg'$  qHS (significant nocturnal                            symptoms)                           - Await pathology  results.                           - Recommend barium swallow with upper GI series to                            further evaluate, assess for dysmotility, etc.                            Surgical follow up likely warranted Stephanie Silva P. Stephanie Givhan, MD 05/07/2022 2:19:15 PM This report has been signed electronically.

## 2022-05-08 ENCOUNTER — Other Ambulatory Visit (HOSPITAL_COMMUNITY): Payer: Self-pay

## 2022-05-08 ENCOUNTER — Telehealth: Payer: Self-pay

## 2022-05-08 MED ORDER — PANTOPRAZOLE SODIUM 40 MG PO TBEC
DELAYED_RELEASE_TABLET | ORAL | 2 refills | Status: DC
  Filled 2022-05-08: qty 60, 30d supply, fill #0
  Filled 2022-06-09: qty 60, 30d supply, fill #1
  Filled 2022-07-19 – 2022-10-26 (×2): qty 60, 30d supply, fill #2

## 2022-05-08 NOTE — Telephone Encounter (Signed)
Sorry to hear this. Unfortunately if this is the case, would try pantoprazole '40mg'$  BID for 1 month supply to try first, with refills. Thanks

## 2022-05-08 NOTE — Telephone Encounter (Signed)
Called and spoke with patient. I informed her of Dr. Doyne Keel recommendations regarding alternative medication. Pt is aware that I have sent RX for Pantoprazole 40 mg BID to [facility]. Pt verbalized understanding and had no concerns at the end of the call.

## 2022-05-08 NOTE — Telephone Encounter (Signed)
  Follow up Call-     05/07/2022    1:10 PM  Call back number  Post procedure Call Back phone  # [phone]  Permission to leave phone message Yes     Patient questions:  Do you have a fever, pain , or abdominal swelling? No. Pain Score  0 *  Have you tolerated food without any problems? Yes.    Have you been able to return to your normal activities? Yes.    Do you have any questions about your discharge instructions: Diet   No. Medications  No. Follow up visit  No.  Do you have questions or concerns about your Care? No.  Actions: * If pain score is 4 or above: No action needed, pain <4.

## 2022-05-08 NOTE — Telephone Encounter (Signed)
  Follow up Call-     05/07/2022    1:10 PM  Call back number  Post procedure Call Back phone  # [phone]  Permission to leave phone message Yes     Patient questions:  Do you have a fever, pain , or abdominal swelling? No. Pain Score  0 *  Have you tolerated food without any problems? Yes.    Have you been able to return to your normal activities? Yes.    Do you have any questions about your discharge instructions: Diet   No. Medications  Yes.   Follow up visit  No.  Do you have questions or concerns about your Care? No.  Actions: * If pain score is 4 or above: No action needed, pain <4. Patient states that insurance denied new prescribed medication Dexilant. Note sent to Dr.Armbruster for further recommendations.

## 2022-05-09 ENCOUNTER — Telehealth: Payer: Self-pay

## 2022-05-09 ENCOUNTER — Other Ambulatory Visit (HOSPITAL_COMMUNITY): Payer: Self-pay

## 2022-05-09 DIAGNOSIS — K219 Gastro-esophageal reflux disease without esophagitis: Secondary | ICD-10-CM

## 2022-05-09 DIAGNOSIS — R1310 Dysphagia, unspecified: Secondary | ICD-10-CM

## 2022-05-09 NOTE — Telephone Encounter (Signed)
Per 9/11 procedure report - Recommend barium swallow with upper GI series to further evaluate, assess for dysmotility, etc  Barium swallow study order and upper GI series order in epic. Secure staff message sent to radiology scheduling to contact patient to schedule.

## 2022-05-17 ENCOUNTER — Other Ambulatory Visit (HOSPITAL_COMMUNITY): Payer: Self-pay

## 2022-05-17 MED ORDER — HYDROCODONE-ACETAMINOPHEN 5-325 MG PO TABS
1.0000 | ORAL_TABLET | ORAL | 0 refills | Status: DC | PRN
  Filled 2022-05-17: qty 90, 8d supply, fill #0

## 2022-05-22 ENCOUNTER — Telehealth: Payer: Self-pay

## 2022-05-22 ENCOUNTER — Other Ambulatory Visit (HOSPITAL_COMMUNITY): Payer: Self-pay

## 2022-05-22 NOTE — Telephone Encounter (Signed)
Lm on vm for patient to return call to review pathology results.

## 2022-05-22 NOTE — Telephone Encounter (Signed)
Dear Ms. Batley,   This message is to relay the results of your recent endoscopy and colonoscopy.   The polyp that was removed from your colon was considered to be "hyperplastic". Hyperplastic polyps are benign and have no cancerous potential. However, in the right side of your colon where this was located, it can be challenging to differentiate a hyperplastic polyp from a truly pre-cancerous polyp, called a sessile serrated polyp. I had the pathology look at this further, and they do think it is truly a hyperplastic polyp which should not have any cancerous potential. As such, you should not need another colonoscopy for 10 years.   Otherwise, your endoscopy was remarkable for post surgical changes of your stomach and significant inflammation of your lower esophagus, despite being on good medications for reflux. Biopsies showed inflammation of your esophagus which I suspect is related to severe reflux. I had recommended adding Reglan to your regimen at bedtime to see if this will help reduce your nighttime reflux symptoms. I have otherwise ordered a radiology test called a barium swallow / upper GI series, to further evaluate the anatomy of your stomach, evaluate for hiatal hernia. If your symptoms persist despite high dose medications, you may need to see a surgeon to discuss your surgical options. I will await the radiology test with further recommendations. Please call our office to schedule if you have not yet scheduled a date / time for this, as the exams have already been ordered.    Please let me know if any questions about these results / recommendations otherwise.   Jolly Mango, MD [facility]

## 2022-05-23 ENCOUNTER — Encounter (HOSPITAL_BASED_OUTPATIENT_CLINIC_OR_DEPARTMENT_OTHER): Payer: Self-pay | Admitting: Emergency Medicine

## 2022-05-23 ENCOUNTER — Emergency Department (HOSPITAL_BASED_OUTPATIENT_CLINIC_OR_DEPARTMENT_OTHER)
Admission: EM | Admit: 2022-05-23 | Discharge: 2022-05-24 | Disposition: A | Discharge status: Home / Self Care | Payer: 59 | Attending: Emergency Medicine | Admitting: Emergency Medicine

## 2022-05-23 ENCOUNTER — Emergency Department (HOSPITAL_BASED_OUTPATIENT_CLINIC_OR_DEPARTMENT_OTHER): Payer: 59

## 2022-05-23 ENCOUNTER — Other Ambulatory Visit: Payer: Self-pay

## 2022-05-23 DIAGNOSIS — R1310 Dysphagia, unspecified: Secondary | ICD-10-CM | POA: Diagnosis not present

## 2022-05-23 DIAGNOSIS — M25552 Pain in left hip: Secondary | ICD-10-CM | POA: Insufficient documentation

## 2022-05-23 DIAGNOSIS — W108XXA Fall (on) (from) other stairs and steps, initial encounter: Secondary | ICD-10-CM | POA: Insufficient documentation

## 2022-05-23 DIAGNOSIS — M546 Pain in thoracic spine: Secondary | ICD-10-CM | POA: Diagnosis not present

## 2022-05-23 DIAGNOSIS — Y92813 Airplane as the place of occurrence of the external cause: Secondary | ICD-10-CM | POA: Diagnosis not present

## 2022-05-23 DIAGNOSIS — G8911 Acute pain due to trauma: Secondary | ICD-10-CM | POA: Diagnosis not present

## 2022-05-23 DIAGNOSIS — M545 Low back pain, unspecified: Secondary | ICD-10-CM | POA: Diagnosis not present

## 2022-05-23 DIAGNOSIS — M25551 Pain in right hip: Secondary | ICD-10-CM | POA: Diagnosis not present

## 2022-05-23 DIAGNOSIS — Z043 Encounter for examination and observation following other accident: Secondary | ICD-10-CM | POA: Diagnosis not present

## 2022-05-23 DIAGNOSIS — W19XXXA Unspecified fall, initial encounter: Secondary | ICD-10-CM

## 2022-05-23 DIAGNOSIS — M47816 Spondylosis without myelopathy or radiculopathy, lumbar region: Secondary | ICD-10-CM | POA: Diagnosis not present

## 2022-05-23 DIAGNOSIS — I10 Essential (primary) hypertension: Secondary | ICD-10-CM | POA: Diagnosis not present

## 2022-05-23 MED ORDER — ONDANSETRON HCL 4 MG/2ML IJ SOLN
4.0000 mg | Freq: Once | INTRAMUSCULAR | Status: AC
  Administered 2022-05-23: 4 mg via INTRAVENOUS
  Filled 2022-05-23: qty 2

## 2022-05-23 MED ORDER — HYDROMORPHONE HCL 1 MG/ML IJ SOLN
1.0000 mg | Freq: Once | INTRAMUSCULAR | Status: AC
  Administered 2022-05-23: 1 mg via INTRAVENOUS
  Filled 2022-05-23: qty 1

## 2022-05-23 NOTE — ED Triage Notes (Signed)
Patient arrived via POV c/o fall down flight of stairs. Patient denies LOC. Patient states pain to pelvis and upper back. Patient states 7/10 pain. Patient is AO x 4, VS w/ elevated BP, unable to walk at this time.

## 2022-05-23 NOTE — Telephone Encounter (Signed)
Returned call to patient. We reviewed pathology result note and recommendations. Pt states that no one contacted her to set up upper GI series or barium swallow study appt. I informed her that we ordered this 2 weeks ago. I tried to provide patient with the phone number to radiology scheduling, but she could not take it down since she was driving and does not use MyChart. I told pt that I will send another message to radiology scheduling to contact her to set up her appts, pt confirmed that she can be reached on her mobile phone. Pt advised to expect a call. Pt verbalized understanding and had no concerns at the end of the call.  Secure staff message sent to radiology scheduling to contact patient to set up her imaging appts.

## 2022-05-23 NOTE — Discharge Instructions (Signed)
Take your pain medication that you have at home.  Follow-up with your doctors.  X-rays of the pelvis and hip without any bony abnormalities.  X-ray of the low back and thoracic back without any acute findings.  There is evidence of a chronic compression fracture at T8.  Expect to be sore and stiff over the next few days.

## 2022-05-23 NOTE — Telephone Encounter (Signed)
Patient returned your call.

## 2022-05-23 NOTE — ED Provider Notes (Signed)
[facility] Provider Note   CSN: [id number] Arrival date & time: 05/23/22  2040     History  Chief Complaint  Patient presents with   Stephanie Silva Stephanie Silva is a 61 y.o. female.  Patient with a fall down the stairs today at about 1930.  Patient with complaint of thoracic back pain lumbar back pain and some pelvic pain and bilateral hip pain but more so to the left side of the hip.  Patient states pain is 7 out of 10.  Patient did not hit her head no loss of consciousness.  No complaint of any back pain.  Chart review shows the patient has a history of idiopathic scoliosis osteoporosis history of migraines hypertension gastroesophageal reflux disease.  Past surgical history significant for gastric bypass and vaginal hysterectomy.  Chart review shows that patient is on chronic pain medicine.  And has an active prescription for hydrocodone.  Patient has not ever used tobacco products.  Edition patient denies any numbness or weakness to her lower extremities.       Home Medications Prior to Admission medications   Medication Sig Start Date End Date Taking? Authorizing Provider  chlorthalidone (HYGROTON) 25 MG tablet TAKE 1 TABLET BY MOUTH ONCE A DAY 06/09/21     famotidine (PEPCID) 40 MG tablet Take 1 tablet by mouth once a day 12/18/21     HYDROcodone-acetaminophen (NORCO/VICODIN) 5-325 MG tablet Take 1 to 2 tablets by mouth every 4 to 6 hours as needed for pain 04/12/22     HYDROcodone-acetaminophen (NORCO/VICODIN) 5-325 MG tablet Take 1-2 tablets by mouth every 4 (four) to 6 (six) hours as needed for pain. 05/16/22     lidocaine (XYLOCAINE) 2 % solution TAKE 5 MLS by mouth 3 TIMES daily as needed  (MIX WITH 25 MLS OF LIQUID ANTACID OTC) 12/29/21     loratadine (CLARITIN) 10 MG tablet Take by mouth.    [provider]  LORazepam (ATIVAN) 0.5 MG tablet TAKE 1 TABLET BY MOUTH 3 TIMES A DAY AS NEEDED 04/03/22     losartan (COZAAR) 100 MG tablet Take 1  tablet (100 mg total) by mouth daily. 05/03/22     methocarbamol (ROBAXIN) 750 MG tablet Take 1 tablet by mouth 3 times a day 04/19/22     metoCLOPramide (REGLAN) 5 MG tablet Take 1 tablet (5 mg total) by mouth at bedtime. 05/07/22   Armbruster, Carlota Raspberry, MD  ondansetron (ZOFRAN-ODT) 8 MG disintegrating tablet Dissolve 1 tablet by mouth every 8 hours as needed for nausea 01/10/21     pantoprazole (PROTONIX) 40 MG tablet Take 1 tablet (40 mg total) by mouth 2 (two) times daily, 30 minutes before a meal. 05/08/22   Armbruster, Carlota Raspberry, MD  pregabalin (LYRICA) 150 MG capsule TAKE 1 CAPSULE BY MOUTH TWICE DAILY 12/18/21     scopolamine (TRANSDERM-SCOP) 1 MG/3DAYS APPLY 1 TRANSDERMAL PATCH TO SKIN EVERY THREE DAYS 09/26/21     sucralfate (CARAFATE) 1 g tablet TAKE 1 TABLET BY MOUTH 30 MINUTES TO 1 HOUR  PRIOR TO MEALS 01/01/22     SUMAtriptan (IMITREX) 100 MG tablet Take 1 tablet by mouth as needed 05/06/21     zolpidem (AMBIEN) 10 MG tablet TAKE 1 TABLET BY MOUTH AT BEDTIME 11/27/21         Allergies    Demerol [meperidine hcl], Meperidine, Nitrofurantoin, and Polyethyl glyc-propyl glyc pf    Review of Systems   Review of Systems  Constitutional:  Negative  for chills and fever.  HENT:  Negative for ear pain and sore throat.   Eyes:  Negative for pain and visual disturbance.  Respiratory:  Negative for cough and shortness of breath.   Cardiovascular:  Negative for chest pain and palpitations.  Gastrointestinal:  Negative for abdominal pain and vomiting.  Genitourinary:  Negative for dysuria and hematuria.  Musculoskeletal:  Positive for back pain. Negative for arthralgias and neck pain.  Skin:  Negative for color change and rash.  Neurological:  Negative for seizures and syncope.  All other systems reviewed and are negative.   Physical Exam Updated Vital Signs BP (!) 118/91 (BP Location: Right Arm)   Pulse 96   Temp 98.6 F (37 C) (Oral)   Resp 19   Ht 1.651 m ('5\' 5"'$ )   Wt 74.8 kg   LMP  (LMP  Unknown)   SpO2 100%   BMI 27.46 kg/m  Physical Exam Vitals and nursing note reviewed.  Constitutional:      General: She is not in acute distress.    Appearance: Normal appearance. She is well-developed.  HENT:     Head: Normocephalic and atraumatic.  Eyes:     Extraocular Movements: Extraocular movements intact.     Conjunctiva/sclera: Conjunctivae normal.     Pupils: Pupils are equal, round, and reactive to light.  Cardiovascular:     Rate and Rhythm: Normal rate and regular rhythm.     Heart sounds: No murmur heard. Pulmonary:     Effort: Pulmonary effort is normal. No respiratory distress.     Breath sounds: Normal breath sounds.  Abdominal:     Palpations: Abdomen is soft.     Tenderness: There is no abdominal tenderness.  Musculoskeletal:        General: Tenderness present. No swelling.     Cervical back: Normal range of motion and neck supple. No tenderness.     Comments: Some tenderness to the thoracic spine lumbar spine some pain to the left hip area.  No deformities no leg shortening neurovascularly intact distally to the feet.  Skin:    General: Skin is warm and dry.     Capillary Refill: Capillary refill takes less than 2 seconds.  Neurological:     General: No focal deficit present.     Mental Status: She is alert and oriented to person, place, and time.     Cranial Nerves: No cranial nerve deficit.     Sensory: No sensory deficit.     Motor: No weakness.  Psychiatric:        Mood and Affect: Mood normal.     ED Results / Procedures / Treatments   Labs (all labs ordered are listed, but only abnormal results are displayed) Labs Reviewed - No data to display  EKG None  Radiology DG Thoracic Spine 2 View  Result Date: 05/23/2022 CLINICAL DATA:  Fall with back pain EXAM: THORACIC SPINE 2 VIEWS COMPARISON:  10/26/2020 FINDINGS: Scoliosis of the thoracolumbar spine. None lateral views are limited by cross-table technique. Chronic compression fracture at  approximate T8 level. Multilevel degenerative osteophytes. IMPRESSION: Chronic compression fracture at T8. Scoliosis with multilevel degenerative change Electronically Signed   By: Donavan Foil M.D.   On: 05/23/2022 23:05   DG Lumbar Spine Complete  Result Date: 05/23/2022 CLINICAL DATA:  Fall EXAM: LUMBAR SPINE - COMPLETE 4+ VIEW COMPARISON:  None Available. FINDINGS: S shaped scoliosis. 9 mm retrolisthesis L2 on L3. Vertebral body heights are maintained. Mild disc space narrowing and  degenerative change L1-L2, L2-L3 and L5-S1. Marked facet degenerative change of the lower lumbar spine. IMPRESSION: Scoliosis and degenerative change.  No acute osseous abnormality Electronically Signed   By: Donavan Foil M.D.   On: 05/23/2022 23:03   DG HIPS BILAT WITH PELVIS MIN 5 VIEWS  Result Date: 05/23/2022 CLINICAL DATA:  Fall down stairs. EXAM: DG HIP (WITH OR WITHOUT PELVIS) 5+V BILAT COMPARISON:  None Available. FINDINGS: There is no evidence of hip fracture or dislocation. There is no evidence of arthropathy or other focal bone abnormality. IMPRESSION: Negative. Electronically Signed   By: Ronney Asters M.D.   On: 05/23/2022 21:54    Procedures Procedures    Medications Ordered in ED Medications  HYDROmorphone (DILAUDID) injection 1 mg (1 mg Intravenous Given 05/23/22 2309)  ondansetron (ZOFRAN) injection 4 mg (4 mg Intravenous Given 05/23/22 2308)    ED Course/ Medical Decision Making/ A&P                           Medical Decision Making Amount and/or Complexity of Data Reviewed Radiology: ordered.  Risk Prescription drug management.   Patient treated here with hydrocodone and Zofran.  Patient feeling better.  Bilateral hips and pelvis without any bony abnormality.  Lumbar spine without any bony abnormalities.  Thoracic spine shows evidence of a chronic compression fracture T8.  And some scoliosis multilevel degenerative changes.  No evidence of any acute fractures.  Patient has  chronic pain medication prescription available at home.  Patient will be given work note if needed.  No acute findings.  Patient stable for discharge home.   Final Clinical Impression(s) / ED Diagnoses Final diagnoses:  Fall, initial encounter  Bilateral hip pain  Acute midline low back pain without sciatica  Acute midline thoracic back pain    Rx / DC Orders ED Discharge Orders     None         Fredia Sorrow, MD 05/23/22 2329

## 2022-05-29 DIAGNOSIS — M25552 Pain in left hip: Secondary | ICD-10-CM | POA: Diagnosis not present

## 2022-06-01 ENCOUNTER — Other Ambulatory Visit (HOSPITAL_COMMUNITY): Payer: Self-pay

## 2022-06-01 DIAGNOSIS — S32602A Unspecified fracture of left ischium, initial encounter for closed fracture: Secondary | ICD-10-CM | POA: Diagnosis not present

## 2022-06-01 DIAGNOSIS — S76012A Strain of muscle, fascia and tendon of left hip, initial encounter: Secondary | ICD-10-CM | POA: Diagnosis not present

## 2022-06-01 MED ORDER — LORAZEPAM 0.5 MG PO TABS
0.5000 mg | ORAL_TABLET | Freq: Three times a day (TID) | ORAL | 1 refills | Status: DC | PRN
  Filled 2022-06-01: qty 90, 30d supply, fill #0

## 2022-06-01 MED ORDER — HYDROCODONE-ACETAMINOPHEN 10-325 MG PO TABS
1.0000 | ORAL_TABLET | ORAL | 0 refills | Status: DC | PRN
  Filled 2022-06-01: qty 150, 25d supply, fill #0

## 2022-06-02 ENCOUNTER — Other Ambulatory Visit (HOSPITAL_COMMUNITY): Payer: Self-pay

## 2022-06-05 ENCOUNTER — Other Ambulatory Visit (HOSPITAL_COMMUNITY): Payer: Self-pay

## 2022-06-05 ENCOUNTER — Other Ambulatory Visit (HOSPITAL_BASED_OUTPATIENT_CLINIC_OR_DEPARTMENT_OTHER): Payer: Self-pay

## 2022-06-06 ENCOUNTER — Other Ambulatory Visit (HOSPITAL_COMMUNITY): Payer: Self-pay

## 2022-06-06 MED ORDER — PREGABALIN 150 MG PO CAPS
150.0000 mg | ORAL_CAPSULE | Freq: Two times a day (BID) | ORAL | 1 refills | Status: DC
  Filled 2022-06-06 – 2022-06-14 (×3): qty 180, 90d supply, fill #0
  Filled 2022-12-06: qty 180, 90d supply, fill #1

## 2022-06-09 ENCOUNTER — Other Ambulatory Visit (HOSPITAL_BASED_OUTPATIENT_CLINIC_OR_DEPARTMENT_OTHER): Payer: Self-pay

## 2022-06-11 ENCOUNTER — Other Ambulatory Visit (HOSPITAL_COMMUNITY): Payer: Self-pay

## 2022-06-11 ENCOUNTER — Other Ambulatory Visit (HOSPITAL_BASED_OUTPATIENT_CLINIC_OR_DEPARTMENT_OTHER): Payer: Self-pay

## 2022-06-11 MED ORDER — SUMATRIPTAN SUCCINATE 100 MG PO TABS
100.0000 mg | ORAL_TABLET | ORAL | 3 refills | Status: DC | PRN
  Filled 2022-06-11 – 2022-07-16 (×2): qty 27, 90d supply, fill #0
  Filled 2022-10-18: qty 27, 90d supply, fill #1
  Filled 2023-04-12: qty 27, 90d supply, fill #2

## 2022-06-11 MED ORDER — ONDANSETRON 8 MG PO TBDP
8.0000 mg | ORAL_TABLET | Freq: Three times a day (TID) | ORAL | 1 refills | Status: DC
  Filled 2022-06-11: qty 30, 10d supply, fill #0
  Filled 2022-08-24: qty 30, 10d supply, fill #1

## 2022-06-11 MED ORDER — SUCRALFATE 1 G PO TABS
1.0000 g | ORAL_TABLET | Freq: Every day | ORAL | 3 refills | Status: DC
  Filled 2022-06-11: qty 90, 90d supply, fill #0
  Filled 2022-09-24: qty 90, 90d supply, fill #1
  Filled 2022-09-27: qty 90, 90d supply, fill #0
  Filled 2023-02-06: qty 90, 90d supply, fill #1

## 2022-06-11 MED ORDER — CHLORTHALIDONE 25 MG PO TABS
25.0000 mg | ORAL_TABLET | Freq: Every day | ORAL | 3 refills | Status: DC
  Filled 2022-06-11 – 2022-07-16 (×2): qty 90, 90d supply, fill #0
  Filled 2022-10-12: qty 90, 90d supply, fill #1
  Filled 2023-04-01: qty 90, 90d supply, fill #2

## 2022-06-11 MED ORDER — PREGABALIN 150 MG PO CAPS
150.0000 mg | ORAL_CAPSULE | Freq: Two times a day (BID) | ORAL | 1 refills | Status: AC
  Filled 2023-01-29 – 2023-03-11 (×2): qty 180, 90d supply, fill #0

## 2022-06-13 ENCOUNTER — Telehealth: Payer: Self-pay

## 2022-06-13 NOTE — Telephone Encounter (Signed)
-----   Message from April H Pait sent at 06/13/2022  3:01 PM EDT ----- Regarding: FW: Upper GI series and Barium swallow study appt  ----- Message ----- From: Cathlean Cower Sent: 06/13/2022   2:52 PM EDT To: April H Pait Subject: RE: Upper GI series and Barium swallow study#  10/18 - patient declined to schedule.   ----- Message ----- From: Belva Chimes H Sent: 06/05/2022   3:08 PM EDT To: Cathlean Cower Subject: FW: Upper GI series and Barium swallow study#   ----- Message ----- From: Yevette Edwards, RN Sent: 05/09/2022   1:04 PM EDT To: April H Pait; Roosvelt Maser Subject: Upper GI series and Barium swallow study appt  Please contact patient to schedule upper GI series and barium swallow study. The orders are in epic. Thanks

## 2022-06-14 ENCOUNTER — Other Ambulatory Visit (HOSPITAL_COMMUNITY): Payer: Self-pay

## 2022-06-14 DIAGNOSIS — S32592D Other specified fracture of left pubis, subsequent encounter for fracture with routine healing: Secondary | ICD-10-CM | POA: Diagnosis not present

## 2022-06-19 ENCOUNTER — Other Ambulatory Visit (HOSPITAL_BASED_OUTPATIENT_CLINIC_OR_DEPARTMENT_OTHER): Payer: Self-pay

## 2022-06-19 ENCOUNTER — Other Ambulatory Visit (HOSPITAL_COMMUNITY): Payer: Self-pay

## 2022-06-19 MED ORDER — METHOCARBAMOL 750 MG PO TABS
750.0000 mg | ORAL_TABLET | Freq: Three times a day (TID) | ORAL | 1 refills | Status: DC
  Filled 2022-06-19: qty 74, 25d supply, fill #0
  Filled 2022-06-19: qty 16, 5d supply, fill #0
  Filled 2022-07-20: qty 90, 30d supply, fill #1

## 2022-07-03 ENCOUNTER — Other Ambulatory Visit (HOSPITAL_BASED_OUTPATIENT_CLINIC_OR_DEPARTMENT_OTHER): Payer: Self-pay

## 2022-07-03 MED ORDER — HYDROCODONE-ACETAMINOPHEN 10-325 MG PO TABS
1.0000 | ORAL_TABLET | ORAL | 0 refills | Status: DC | PRN
  Filled 2022-07-03: qty 150, 25d supply, fill #0

## 2022-07-04 ENCOUNTER — Other Ambulatory Visit (HOSPITAL_BASED_OUTPATIENT_CLINIC_OR_DEPARTMENT_OTHER): Payer: Self-pay

## 2022-07-04 MED ORDER — LORAZEPAM 0.5 MG PO TABS
0.5000 mg | ORAL_TABLET | Freq: Three times a day (TID) | ORAL | 1 refills | Status: AC | PRN
  Filled 2022-07-04: qty 90, 30d supply, fill #0
  Filled 2022-07-31: qty 71, 23d supply, fill #1
  Filled 2022-07-31: qty 19, 7d supply, fill #1
  Filled 2022-07-31: qty 90, 30d supply, fill #1

## 2022-07-12 DIAGNOSIS — G510 Bell's palsy: Secondary | ICD-10-CM | POA: Diagnosis not present

## 2022-07-12 DIAGNOSIS — S32602D Unspecified fracture of left ischium, subsequent encounter for fracture with routine healing: Secondary | ICD-10-CM | POA: Diagnosis not present

## 2022-07-12 DIAGNOSIS — G500 Trigeminal neuralgia: Secondary | ICD-10-CM | POA: Diagnosis not present

## 2022-07-12 DIAGNOSIS — E559 Vitamin D deficiency, unspecified: Secondary | ICD-10-CM | POA: Diagnosis not present

## 2022-07-12 DIAGNOSIS — I10 Essential (primary) hypertension: Secondary | ICD-10-CM | POA: Diagnosis not present

## 2022-07-12 DIAGNOSIS — Z6829 Body mass index (BMI) 29.0-29.9, adult: Secondary | ICD-10-CM | POA: Diagnosis not present

## 2022-07-12 DIAGNOSIS — G4709 Other insomnia: Secondary | ICD-10-CM | POA: Diagnosis not present

## 2022-07-12 DIAGNOSIS — M810 Age-related osteoporosis without current pathological fracture: Secondary | ICD-10-CM | POA: Diagnosis not present

## 2022-07-12 DIAGNOSIS — K219 Gastro-esophageal reflux disease without esophagitis: Secondary | ICD-10-CM | POA: Diagnosis not present

## 2022-07-13 ENCOUNTER — Other Ambulatory Visit (HOSPITAL_BASED_OUTPATIENT_CLINIC_OR_DEPARTMENT_OTHER): Payer: Self-pay

## 2022-07-13 DIAGNOSIS — S32592D Other specified fracture of left pubis, subsequent encounter for fracture with routine healing: Secondary | ICD-10-CM | POA: Diagnosis not present

## 2022-07-13 DIAGNOSIS — M25552 Pain in left hip: Secondary | ICD-10-CM | POA: Diagnosis not present

## 2022-07-16 ENCOUNTER — Other Ambulatory Visit (HOSPITAL_BASED_OUTPATIENT_CLINIC_OR_DEPARTMENT_OTHER): Payer: Self-pay

## 2022-07-20 ENCOUNTER — Other Ambulatory Visit (HOSPITAL_COMMUNITY): Payer: Self-pay

## 2022-07-20 ENCOUNTER — Other Ambulatory Visit (HOSPITAL_BASED_OUTPATIENT_CLINIC_OR_DEPARTMENT_OTHER): Payer: Self-pay

## 2022-07-30 ENCOUNTER — Other Ambulatory Visit (HOSPITAL_COMMUNITY): Payer: Self-pay

## 2022-07-31 ENCOUNTER — Other Ambulatory Visit (HOSPITAL_BASED_OUTPATIENT_CLINIC_OR_DEPARTMENT_OTHER): Payer: Self-pay

## 2022-07-31 MED ORDER — ZOLPIDEM TARTRATE 10 MG PO TABS
10.0000 mg | ORAL_TABLET | Freq: Every day | ORAL | 1 refills | Status: DC
  Filled 2022-07-31: qty 90, 90d supply, fill #0
  Filled 2022-10-26: qty 90, 90d supply, fill #1

## 2022-08-01 ENCOUNTER — Other Ambulatory Visit (HOSPITAL_BASED_OUTPATIENT_CLINIC_OR_DEPARTMENT_OTHER): Payer: Self-pay

## 2022-08-01 MED ORDER — HYDROCODONE-ACETAMINOPHEN 10-325 MG PO TABS
1.0000 | ORAL_TABLET | ORAL | 0 refills | Status: DC | PRN
  Filled 2022-08-01: qty 150, 25d supply, fill #0

## 2022-08-02 ENCOUNTER — Other Ambulatory Visit (HOSPITAL_COMMUNITY): Payer: Self-pay

## 2022-08-03 ENCOUNTER — Other Ambulatory Visit (HOSPITAL_BASED_OUTPATIENT_CLINIC_OR_DEPARTMENT_OTHER): Payer: Self-pay

## 2022-08-08 DIAGNOSIS — M25552 Pain in left hip: Secondary | ICD-10-CM | POA: Diagnosis not present

## 2022-08-08 DIAGNOSIS — S32592D Other specified fracture of left pubis, subsequent encounter for fracture with routine healing: Secondary | ICD-10-CM | POA: Diagnosis not present

## 2022-08-11 ENCOUNTER — Other Ambulatory Visit (HOSPITAL_COMMUNITY): Payer: Self-pay

## 2022-08-21 ENCOUNTER — Other Ambulatory Visit (HOSPITAL_BASED_OUTPATIENT_CLINIC_OR_DEPARTMENT_OTHER): Payer: Self-pay

## 2022-08-23 ENCOUNTER — Other Ambulatory Visit (HOSPITAL_BASED_OUTPATIENT_CLINIC_OR_DEPARTMENT_OTHER): Payer: Self-pay

## 2022-08-23 MED ORDER — METHOCARBAMOL 750 MG PO TABS
750.0000 mg | ORAL_TABLET | Freq: Three times a day (TID) | ORAL | 1 refills | Status: DC
  Filled 2022-08-23: qty 90, 30d supply, fill #0
  Filled 2022-10-22: qty 90, 30d supply, fill #1

## 2022-08-29 ENCOUNTER — Other Ambulatory Visit (HOSPITAL_BASED_OUTPATIENT_CLINIC_OR_DEPARTMENT_OTHER): Payer: Self-pay

## 2022-08-31 ENCOUNTER — Other Ambulatory Visit (HOSPITAL_BASED_OUTPATIENT_CLINIC_OR_DEPARTMENT_OTHER): Payer: Self-pay

## 2022-08-31 ENCOUNTER — Other Ambulatory Visit (HOSPITAL_COMMUNITY): Payer: Self-pay

## 2022-08-31 MED ORDER — LORAZEPAM 0.5 MG PO TABS
0.5000 mg | ORAL_TABLET | Freq: Three times a day (TID) | ORAL | 1 refills | Status: DC | PRN
  Filled 2022-08-31: qty 90, 30d supply, fill #0
  Filled 2023-01-04: qty 90, 30d supply, fill #1

## 2022-09-03 ENCOUNTER — Other Ambulatory Visit (HOSPITAL_COMMUNITY): Payer: Self-pay

## 2022-09-03 MED ORDER — PREGABALIN 150 MG PO CAPS
150.0000 mg | ORAL_CAPSULE | Freq: Two times a day (BID) | ORAL | 1 refills | Status: AC
  Filled 2022-09-03 – 2022-09-10 (×2): qty 180, 90d supply, fill #0

## 2022-09-10 ENCOUNTER — Other Ambulatory Visit: Payer: Self-pay

## 2022-09-10 ENCOUNTER — Other Ambulatory Visit (HOSPITAL_COMMUNITY): Payer: Self-pay

## 2022-09-11 ENCOUNTER — Other Ambulatory Visit (HOSPITAL_COMMUNITY): Payer: Self-pay

## 2022-09-11 MED ORDER — HYDROCODONE-ACETAMINOPHEN 10-325 MG PO TABS
1.0000 | ORAL_TABLET | ORAL | 0 refills | Status: DC | PRN
  Filled 2022-09-11: qty 150, 25d supply, fill #0

## 2022-09-24 ENCOUNTER — Other Ambulatory Visit: Payer: Self-pay | Admitting: Gastroenterology

## 2022-09-24 ENCOUNTER — Other Ambulatory Visit (HOSPITAL_COMMUNITY): Payer: Self-pay

## 2022-09-24 DIAGNOSIS — R1310 Dysphagia, unspecified: Secondary | ICD-10-CM

## 2022-09-24 MED ORDER — METHOCARBAMOL 750 MG PO TABS
750.0000 mg | ORAL_TABLET | Freq: Three times a day (TID) | ORAL | 1 refills | Status: DC
  Filled 2022-09-24: qty 90, 30d supply, fill #0
  Filled 2022-11-20: qty 90, 30d supply, fill #1

## 2022-09-25 ENCOUNTER — Other Ambulatory Visit: Payer: Self-pay

## 2022-09-25 ENCOUNTER — Other Ambulatory Visit (HOSPITAL_COMMUNITY): Payer: Self-pay

## 2022-09-25 MED ORDER — METOCLOPRAMIDE HCL 5 MG PO TABS
5.0000 mg | ORAL_TABLET | Freq: Every evening | ORAL | 2 refills | Status: DC
  Filled 2022-09-25: qty 30, 30d supply, fill #0
  Filled 2022-10-22: qty 30, 30d supply, fill #1
  Filled 2022-11-20: qty 30, 30d supply, fill #2

## 2022-09-26 ENCOUNTER — Other Ambulatory Visit (HOSPITAL_BASED_OUTPATIENT_CLINIC_OR_DEPARTMENT_OTHER): Payer: Self-pay

## 2022-09-26 ENCOUNTER — Other Ambulatory Visit (HOSPITAL_COMMUNITY): Payer: Self-pay

## 2022-09-27 ENCOUNTER — Other Ambulatory Visit (HOSPITAL_COMMUNITY): Payer: Self-pay

## 2022-09-27 MED ORDER — LORAZEPAM 0.5 MG PO TABS
0.5000 mg | ORAL_TABLET | Freq: Three times a day (TID) | ORAL | 1 refills | Status: DC | PRN
  Filled 2022-09-27: qty 90, 30d supply, fill #0
  Filled 2022-10-29: qty 90, 30d supply, fill #1

## 2022-10-12 ENCOUNTER — Other Ambulatory Visit (HOSPITAL_COMMUNITY): Payer: Self-pay

## 2022-10-12 MED ORDER — HYDROCODONE-ACETAMINOPHEN 10-325 MG PO TABS
1.0000 | ORAL_TABLET | ORAL | 0 refills | Status: DC | PRN
  Filled 2022-10-12: qty 150, 25d supply, fill #0

## 2022-10-15 ENCOUNTER — Other Ambulatory Visit (HOSPITAL_COMMUNITY): Payer: Self-pay

## 2022-10-16 ENCOUNTER — Other Ambulatory Visit (HOSPITAL_COMMUNITY): Payer: Self-pay

## 2022-10-18 ENCOUNTER — Other Ambulatory Visit (HOSPITAL_COMMUNITY): Payer: Self-pay

## 2022-10-22 ENCOUNTER — Other Ambulatory Visit (HOSPITAL_COMMUNITY): Payer: Self-pay

## 2022-10-29 ENCOUNTER — Other Ambulatory Visit: Payer: Self-pay

## 2022-10-29 ENCOUNTER — Other Ambulatory Visit (HOSPITAL_COMMUNITY): Payer: Self-pay

## 2022-11-19 ENCOUNTER — Other Ambulatory Visit (HOSPITAL_COMMUNITY): Payer: Self-pay

## 2022-11-20 ENCOUNTER — Other Ambulatory Visit (HOSPITAL_COMMUNITY): Payer: Self-pay

## 2022-11-20 MED ORDER — HYDROCODONE-ACETAMINOPHEN 10-325 MG PO TABS
1.0000 | ORAL_TABLET | ORAL | 0 refills | Status: DC | PRN
  Filled 2022-11-20: qty 150, 25d supply, fill #0

## 2022-11-20 MED ORDER — HYDROCODONE-ACETAMINOPHEN 10-325 MG PO TABS
1.0000 | ORAL_TABLET | ORAL | 0 refills | Status: DC | PRN
  Filled 2023-02-06: qty 150, 25d supply, fill #0

## 2022-11-21 ENCOUNTER — Other Ambulatory Visit (HOSPITAL_BASED_OUTPATIENT_CLINIC_OR_DEPARTMENT_OTHER): Payer: Self-pay

## 2022-11-22 ENCOUNTER — Other Ambulatory Visit (HOSPITAL_BASED_OUTPATIENT_CLINIC_OR_DEPARTMENT_OTHER): Payer: Self-pay

## 2022-11-22 MED ORDER — ONDANSETRON 8 MG PO TBDP
8.0000 mg | ORAL_TABLET | Freq: Three times a day (TID) | ORAL | 1 refills | Status: DC
  Filled 2022-11-22 – 2022-11-26 (×3): qty 30, 10d supply, fill #0
  Filled 2022-12-06: qty 30, 10d supply, fill #1

## 2022-11-26 ENCOUNTER — Other Ambulatory Visit: Payer: Self-pay

## 2022-11-26 ENCOUNTER — Other Ambulatory Visit (HOSPITAL_BASED_OUTPATIENT_CLINIC_OR_DEPARTMENT_OTHER): Payer: Self-pay

## 2022-11-26 ENCOUNTER — Other Ambulatory Visit (HOSPITAL_COMMUNITY): Payer: Self-pay

## 2022-11-27 ENCOUNTER — Other Ambulatory Visit (HOSPITAL_COMMUNITY): Payer: Self-pay

## 2022-11-27 MED ORDER — OMEPRAZOLE 40 MG PO CPDR
40.0000 mg | DELAYED_RELEASE_CAPSULE | Freq: Two times a day (BID) | ORAL | 4 refills | Status: DC
  Filled 2022-11-27: qty 180, 90d supply, fill #0

## 2022-11-30 ENCOUNTER — Other Ambulatory Visit (HOSPITAL_COMMUNITY): Payer: Self-pay

## 2022-12-01 ENCOUNTER — Other Ambulatory Visit (HOSPITAL_COMMUNITY): Payer: Self-pay

## 2022-12-01 MED ORDER — LORAZEPAM 0.5 MG PO TABS
0.5000 mg | ORAL_TABLET | Freq: Three times a day (TID) | ORAL | 0 refills | Status: DC | PRN
  Filled 2022-12-01: qty 90, 30d supply, fill #0

## 2022-12-18 ENCOUNTER — Other Ambulatory Visit: Payer: Self-pay | Admitting: Gastroenterology

## 2022-12-18 ENCOUNTER — Other Ambulatory Visit (HOSPITAL_COMMUNITY): Payer: Self-pay

## 2022-12-18 DIAGNOSIS — R1310 Dysphagia, unspecified: Secondary | ICD-10-CM

## 2022-12-18 MED ORDER — METHOCARBAMOL 750 MG PO TABS
750.0000 mg | ORAL_TABLET | Freq: Three times a day (TID) | ORAL | 1 refills | Status: DC
  Filled 2022-12-18: qty 90, 30d supply, fill #0
  Filled 2023-01-15: qty 90, 30d supply, fill #1

## 2022-12-18 MED ORDER — FAMOTIDINE 40 MG PO TABS
40.0000 mg | ORAL_TABLET | Freq: Every day | ORAL | 3 refills | Status: DC
  Filled 2022-12-18: qty 90, 90d supply, fill #0
  Filled 2023-04-01: qty 90, 90d supply, fill #1
  Filled 2023-07-03: qty 90, 90d supply, fill #2
  Filled 2023-11-18: qty 90, 90d supply, fill #3

## 2022-12-18 MED ORDER — METOCLOPRAMIDE HCL 5 MG PO TABS
5.0000 mg | ORAL_TABLET | Freq: Every evening | ORAL | 2 refills | Status: DC
  Filled 2022-12-18: qty 30, 30d supply, fill #0
  Filled 2023-01-15: qty 30, 30d supply, fill #1
  Filled 2023-02-13: qty 30, 30d supply, fill #2

## 2022-12-19 ENCOUNTER — Other Ambulatory Visit: Payer: Self-pay

## 2022-12-19 ENCOUNTER — Other Ambulatory Visit (HOSPITAL_COMMUNITY): Payer: Self-pay

## 2022-12-24 DIAGNOSIS — I959 Hypotension, unspecified: Secondary | ICD-10-CM | POA: Diagnosis not present

## 2022-12-26 ENCOUNTER — Other Ambulatory Visit (HOSPITAL_COMMUNITY): Payer: Self-pay

## 2022-12-27 ENCOUNTER — Other Ambulatory Visit (HOSPITAL_COMMUNITY): Payer: Self-pay

## 2022-12-28 ENCOUNTER — Other Ambulatory Visit: Payer: Self-pay

## 2022-12-28 ENCOUNTER — Other Ambulatory Visit (HOSPITAL_COMMUNITY): Payer: Self-pay

## 2022-12-28 MED ORDER — HYDROCODONE-ACETAMINOPHEN 10-325 MG PO TABS
1.0000 | ORAL_TABLET | ORAL | 0 refills | Status: DC | PRN
  Filled 2022-12-28: qty 150, 25d supply, fill #0

## 2023-01-02 ENCOUNTER — Other Ambulatory Visit (HOSPITAL_COMMUNITY): Payer: Self-pay

## 2023-01-04 ENCOUNTER — Other Ambulatory Visit: Payer: Self-pay

## 2023-01-15 ENCOUNTER — Other Ambulatory Visit (HOSPITAL_COMMUNITY): Payer: Self-pay

## 2023-01-15 ENCOUNTER — Other Ambulatory Visit: Payer: Self-pay | Admitting: Gastroenterology

## 2023-01-15 MED ORDER — PANTOPRAZOLE SODIUM 40 MG PO TBEC
40.0000 mg | DELAYED_RELEASE_TABLET | Freq: Two times a day (BID) | ORAL | 1 refills | Status: DC
  Filled 2023-01-15: qty 60, 30d supply, fill #0
  Filled 2023-02-13: qty 60, 30d supply, fill #1

## 2023-01-25 ENCOUNTER — Other Ambulatory Visit (HOSPITAL_COMMUNITY): Payer: Self-pay

## 2023-01-25 ENCOUNTER — Other Ambulatory Visit: Payer: Self-pay

## 2023-01-25 ENCOUNTER — Other Ambulatory Visit (HOSPITAL_BASED_OUTPATIENT_CLINIC_OR_DEPARTMENT_OTHER): Payer: Self-pay

## 2023-01-26 ENCOUNTER — Other Ambulatory Visit (HOSPITAL_COMMUNITY): Payer: Self-pay

## 2023-01-26 MED ORDER — ZOLPIDEM TARTRATE 10 MG PO TABS
10.0000 mg | ORAL_TABLET | Freq: Every day | ORAL | 1 refills | Status: DC
  Filled 2023-01-26: qty 90, 90d supply, fill #0
  Filled 2023-04-16 – 2023-07-03 (×2): qty 90, 90d supply, fill #1

## 2023-01-29 ENCOUNTER — Other Ambulatory Visit (HOSPITAL_BASED_OUTPATIENT_CLINIC_OR_DEPARTMENT_OTHER): Payer: Self-pay

## 2023-02-06 ENCOUNTER — Other Ambulatory Visit (HOSPITAL_COMMUNITY): Payer: Self-pay

## 2023-02-06 ENCOUNTER — Other Ambulatory Visit: Payer: Self-pay

## 2023-02-06 MED ORDER — LORAZEPAM 0.5 MG PO TABS
ORAL_TABLET | ORAL | 0 refills | Status: DC
  Filled 2023-02-06: qty 90, 30d supply, fill #0

## 2023-02-06 MED ORDER — ONDANSETRON 8 MG PO TBDP
8.0000 mg | ORAL_TABLET | Freq: Three times a day (TID) | ORAL | 1 refills | Status: DC
  Filled 2023-02-06: qty 30, 10d supply, fill #0
  Filled 2023-03-12: qty 30, 10d supply, fill #1

## 2023-02-11 ENCOUNTER — Other Ambulatory Visit (HOSPITAL_COMMUNITY): Payer: Self-pay

## 2023-02-11 DIAGNOSIS — S52501A Unspecified fracture of the lower end of right radius, initial encounter for closed fracture: Secondary | ICD-10-CM | POA: Diagnosis not present

## 2023-02-11 MED ORDER — OXYCODONE-ACETAMINOPHEN 5-325 MG PO TABS
ORAL_TABLET | ORAL | 0 refills | Status: AC
  Filled 2023-02-11: qty 20, 3d supply, fill #0

## 2023-02-12 ENCOUNTER — Other Ambulatory Visit (HOSPITAL_COMMUNITY): Payer: Self-pay

## 2023-02-12 NOTE — Patient Instructions (Signed)
DUE TO COVID-19 ONLY TWO VISITORS  (aged 63 and older)  ARE ALLOWED TO COME WITH YOU AND STAY IN THE WAITING ROOM ONLY DURING PRE OP AND PROCEDURE.   **NO VISITORS ARE ALLOWED IN THE SHORT STAY AREA OR RECOVERY ROOM!!**  IF YOU WILL BE ADMITTED INTO THE HOSPITAL YOU ARE ALLOWED ONLY FOUR SUPPORT PEOPLE DURING VISITATION HOURS ONLY (7 AM -8PM)   The support person(s) must pass our screening, gel in and out, and wear a mask at all times, including in the patient's room. Patients must also wear a mask when staff or their support person are in the room. Visitors GUEST BADGE MUST BE WORN VISIBLY  One adult visitor may remain with you overnight and MUST be in the room by 8 P.M.     Your procedure is scheduled on: 02/14/23   Report to [facility] Main Entrance    Report to admitting at : 12:45 PM   Call this number if you have problems the morning of surgery [phone]   Do not eat food :After Midnight.   After Midnight you may have the following liquids until : 12:00 PM DAY OF SURGERY  Water Black Coffee (sugar ok, NO MILK/CREAM OR CREAMERS)  Tea (sugar ok, NO MILK/CREAM OR CREAMERS) regular and decaf                             Plain Jell-O (NO RED)                                           Fruit ices (not with fruit pulp, NO RED)                                     Popsicles (NO RED)                                                                  Juice: apple, WHITE grape, WHITE cranberry Sports drinks like Gatorade (NO RED)               Oral Hygiene is also important to reduce your risk of infection.                                    Remember - BRUSH YOUR TEETH THE MORNING OF SURGERY WITH YOUR REGULAR TOOTHPASTE  DENTURES WILL BE REMOVED PRIOR TO SURGERY PLEASE DO NOT APPLY "Poly grip" OR ADHESIVES!!!   Do NOT smoke after Midnight   Take these medicines the morning of surgery with A SIP OF WATER: pregabalin,loratadine,famotidine,pantoprazole.Lorazepam,sumatriptan as  needed.                              You may not have any metal on your body including hair pins, jewelry, and body piercing             Do not wear make-up, lotions, powders, perfumes/cologne, or deodorant  Do not wear nail polish including gel and S&S, artificial/acrylic nails, or any other type of covering on natural nails including finger and toenails. If you have artificial nails, gel coating, etc. that needs to be removed by a nail salon please have this removed prior to surgery or surgery may need to be canceled/ delayed if the surgeon/ anesthesia feels like they are unable to be safely monitored.   Do not shave  48 hours prior to surgery.    Do not bring valuables to the hospital. [facility] IS NOT             RESPONSIBLE   FOR VALUABLES.   Contacts, glasses, or bridgework may not be worn into surgery.   Bring small overnight bag day of surgery.   DO NOT BRING YOUR HOME MEDICATIONS TO THE HOSPITAL. PHARMACY WILL DISPENSE MEDICATIONS LISTED ON YOUR MEDICATION LIST TO YOU DURING YOUR ADMISSION IN THE HOSPITAL!    Patients discharged on the day of surgery will not be allowed to drive home.  Someone NEEDS to stay with you for the first 24 hours after anesthesia.   Special Instructions: Bring a copy of your healthcare power of attorney and living will documents         the day of surgery if you haven't scanned them before.              Please read over the following fact sheets you were given: IF YOU HAVE QUESTIONS ABOUT YOUR PRE-OP INSTRUCTIONS PLEASE CALL [phone]    [facility] - Preparing for Surgery Before surgery, you can play an important role.  Because skin is not sterile, your skin needs to be as free of germs as possible.  You can reduce the number of germs on your skin by washing with CHG (chlorahexidine gluconate) soap before surgery.  CHG is an antiseptic cleaner which kills germs and bonds with the skin to continue killing germs even after washing. Please DO NOT use  if you have an allergy to CHG or antibacterial soaps.  If your skin becomes reddened/irritated stop using the CHG and inform your nurse when you arrive at Short Stay. Do not shave (including legs and underarms) for at least 48 hours prior to the first CHG shower.  You may shave your face/neck. Please follow these instructions carefully:  1.  Shower with CHG Soap the night before surgery and the  morning of Surgery.  2.  If you choose to wash your hair, wash your hair first as usual with your  normal  shampoo.  3.  After you shampoo, rinse your hair and body thoroughly to remove the  shampoo.                           4.  Use CHG as you would any other liquid soap.  You can apply chg directly  to the skin and wash                       Gently with a scrungie or clean washcloth.  5.  Apply the CHG Soap to your body ONLY FROM THE NECK DOWN.   Do not use on face/ open                           Wound or open sores. Avoid contact with eyes, ears mouth and genitals (private parts).  Wash face,  Genitals (private parts) with your normal soap.             6.  Wash thoroughly, paying special attention to the area where your surgery  will be performed.  7.  Thoroughly rinse your body with warm water from the neck down.  8.  DO NOT shower/wash with your normal soap after using and rinsing off  the CHG Soap.                9.  Pat yourself dry with a clean towel.            10.  Wear clean pajamas.            11.  Place clean sheets on your bed the night of your first shower and do not  sleep with pets. Day of Surgery : Do not apply any lotions/deodorants the morning of surgery.  Please wear clean clothes to the hospital/surgery center.  FAILURE TO FOLLOW THESE INSTRUCTIONS MAY RESULT IN THE CANCELLATION OF YOUR SURGERY PATIENT SIGNATURE_________________________________  NURSE  SIGNATURE__________________________________  ________________________________________________________________________

## 2023-02-12 NOTE — Progress Notes (Signed)
Pt. Needs orders for surgery. 

## 2023-02-13 ENCOUNTER — Encounter (HOSPITAL_COMMUNITY): Payer: Self-pay

## 2023-02-13 ENCOUNTER — Other Ambulatory Visit: Payer: Self-pay

## 2023-02-13 ENCOUNTER — Encounter (HOSPITAL_COMMUNITY)
Admission: RE | Admit: 2023-02-13 | Discharge: 2023-02-13 | Disposition: A | Discharge status: Home / Self Care | Payer: Commercial Managed Care - PPO | Source: Ambulatory Visit | Attending: Orthopedic Surgery | Admitting: Orthopedic Surgery

## 2023-02-13 VITALS — BP 127/87 | HR 73 | Temp 98.4°F | Ht 64.0 in | Wt 157.0 lb

## 2023-02-13 DIAGNOSIS — I2510 Atherosclerotic heart disease of native coronary artery without angina pectoris: Secondary | ICD-10-CM | POA: Insufficient documentation

## 2023-02-13 LAB — BASIC METABOLIC PANEL
Anion gap: 8 (ref 5–15)
BUN: 12 mg/dL (ref 8–23)
CO2: 24 mmol/L (ref 22–32)
Calcium: 9.1 mg/dL (ref 8.9–10.3)
Chloride: 107 mmol/L (ref 98–111)
Creatinine, Ser: 0.79 mg/dL (ref 0.44–1.00)
GFR, Estimated: >60 mL/min (ref >60)
Glucose, Bld: 102 mg/dL — ABNORMAL HIGH (ref 70–99)
Potassium: 3.5 mmol/L (ref 3.5–5.1)
Sodium: 139 mmol/L (ref 135–145)

## 2023-02-13 LAB — CBC
HCT: 39.7 % (ref 36.0–46.0)
Hemoglobin: 12.6 g/dL (ref 12.0–15.0)
MCH: 29.6 pg (ref 26.0–34.0)
MCHC: 31.7 g/dL (ref 30.0–36.0)
MCV: 93.2 fL (ref 80.0–100.0)
Platelets: 244 10*3/uL (ref 150–400)
RBC: 4.26 MIL/uL (ref 3.87–5.11)
RDW: 12.3 % (ref 11.5–15.5)
WBC: 6.9 10*3/uL (ref 4.0–10.5)
nRBC: 0 % (ref 0.0–0.2)

## 2023-02-13 NOTE — H&P (Signed)
Preoperative History & Physical Exam  Surgeon: Philipp Ovens, MD  Diagnosis: Right wrist fracture  Planned Procedure: Procedure(s) (LRB): OPEN REDUCTION INTERNAL FIXATION (ORIF) WRIST FRACTURE (Right)  History of Present Illness:   Patient is a 62 y.o. female with symptoms consistent with Right wrist fracture who presents for surgical intervention. The risks, benefits and alternatives of surgical intervention were discussed and informed consent was obtained prior to surgery.  Past Medical History:  Past Medical History:  Diagnosis Date   Arthritis    Bell's palsy    Benign essential hypertension    Chronic disease anemia    Complication of anesthesia    GERD (gastroesophageal reflux disease)    H. pylori infection    Idiopathic scoliosis    Insomnia    Migraine    Osteoporosis    Panic attack    PONV (postoperative nausea and vomiting)    Vitamin D deficiency     Past Surgical History:  Past Surgical History:  Procedure Laterality Date   BACK SURGERY     CHOLECYSTECTOMY     GASTRIC BYPASS     NOSE SURGERY     TONSILLECTOMY     VAGINAL HYSTERECTOMY      Medications:  Prior to Admission medications   Medication Sig Start Date End Date Taking? Authorizing Provider  calcium carbonate (TUMS - DOSED IN MG ELEMENTAL CALCIUM) 500 MG chewable tablet Chew 1,000-1,500 tablets by mouth as needed for indigestion or heartburn.   Yes [provider]  cetirizine (ZYRTEC) 10 MG tablet Take 10 mg by mouth at bedtime.   Yes [provider]  famotidine (PEPCID) 40 MG tablet Take 1 tablet by mouth once a day 12/18/22  Yes   HYDROcodone-acetaminophen (NORCO) 10-325 MG tablet Take 1 tablet by mouth every 4 (four) hours as needed for pain. Patient taking differently: Take 1 tablet by mouth 3 (three) times daily as needed for moderate pain or severe pain. 11/19/22  Yes   ibuprofen (ADVIL) 200 MG tablet Take 800 mg by mouth every 6 (six) hours as needed for moderate pain.    Yes [provider]  lidocaine (XYLOCAINE) 2 % solution TAKE 5 MLS by mouth 3 TIMES daily as needed  (MIX WITH 25 MLS OF LIQUID ANTACID OTC) Patient taking differently: Use as directed 15 mLs in the mouth or throat as needed for mouth pain. 12/29/21  Yes   loratadine (CLARITIN) 10 MG tablet Take 10 mg by mouth daily.   Yes [provider]  LORazepam (ATIVAN) 0.5 MG tablet Take 1 tablet (0.5 mg total) by mouth 3 (three) times daily as needed. Patient taking differently: Take 0.5 mg by mouth 2 (two) times daily as needed for anxiety. 07/03/22  Yes Jim Like, MD  losartan (COZAAR) 100 MG tablet Take 1 tablet (100 mg total) by mouth daily. 05/03/22  Yes   methocarbamol (ROBAXIN) 750 MG tablet Take 1 tablet (750 mg total) by mouth 3 (three) times daily. Patient taking differently: Take 750 mg by mouth as needed for muscle spasms. 12/18/22  Yes   metoCLOPramide (REGLAN) 5 MG tablet Take 1 tablet (5 mg total) by mouth at bedtime. Patient taking differently: Take 5 mg by mouth at bedtime. 12/18/22  Yes Armbruster, Willaim Rayas, MD  ondansetron (ZOFRAN-ODT) 8 MG disintegrating tablet Dissolve 1 tablet by mouth every 8 hours as needed for nausea Patient taking differently: Take 8 mg by mouth as needed for nausea or vomiting. 02/06/23  Yes   pantoprazole (PROTONIX) 40  MG tablet Take 1 tablet (40 mg total) by mouth 2 (two) times daily before a meal. Please schedule a yearly follow up for further refills. Patient taking differently: Take 40 mg by mouth as needed (reflux). 01/15/23  Yes Armbruster, Willaim Rayas, MD  Polyethyl Glycol-Propyl Glycol (SYSTANE OP) Place 1 drop into both eyes as needed (dry eye).   Yes [provider]  pregabalin (LYRICA) 150 MG capsule TAKE 1 CAPSULE BY MOUTH TWICE DAILY Patient taking differently: Take 150 mg by mouth in the morning and at bedtime. 06/11/22  Yes   scopolamine (TRANSDERM-SCOP) 1 MG/3DAYS APPLY 1 TRANSDERMAL PATCH TO SKIN EVERY THREE DAYS Patient  taking differently: Place 1 patch onto the skin as needed (for travel). 09/26/21  Yes   sucralfate (CARAFATE) 1 g tablet TAKE 1 TABLET BY MOUTH 30 MINUTES TO 1 HOUR  PRIOR TO MEALS Patient taking differently: Take 1 g by mouth as needed (reflux). 06/11/22  Yes   SUMAtriptan (IMITREX) 100 MG tablet Take 1 tablet by mouth as needed Patient taking differently: Take 100 mg by mouth as needed for migraine or headache. 06/11/22  Yes   zolpidem (AMBIEN) 10 MG tablet Take 1 tablet (10 mg total) by mouth at bedtime. Patient taking differently: Take 5 mg by mouth at bedtime. 01/26/23  Yes   chlorthalidone (HYGROTON) 25 MG tablet TAKE 1 TABLET BY MOUTH ONCE A DAY Patient not taking: Reported on 02/13/2023 06/11/22     oxyCODONE-acetaminophen (PERCOCET) 5-325 MG tablet Take 1 tablet by mouth every 6 hours Patient not taking: Reported on 02/13/2023 02/11/23     pregabalin (LYRICA) 150 MG capsule Take 1 capsule (150 mg total) by mouth 2 (two) times daily. 09/03/22       Allergies:  Demerol [meperidine hcl], Meperidine, Nitrofurantoin, and Polyethyl glyc-propyl glyc pf  Review of Systems: Negative except per HPI.  Physical Exam: Alert and oriented, NAD Head and neck: no masses, normal alignment CV: pulse intact Pulm: no increased work of breathing, respirations even and unlabored Abdomen: non-distended Extremities: extremities warm and well perfused  LABS: Recent Results (from the past 2160 hour(s))  CBC per protocol     Status: None   Collection Time: 02/13/23  8:37 AM  Result Value Ref Range   WBC 6.9 4.0 - 10.5 K/uL   RBC 4.26 3.87 - 5.11 MIL/uL   Hemoglobin 12.6 12.0 - 15.0 g/dL   HCT 78.2 95.6 - 21.3 %   MCV 93.2 80.0 - 100.0 fL   MCH 29.6 26.0 - 34.0 pg   MCHC 31.7 30.0 - 36.0 g/dL   RDW 08.6 57.8 - 46.9 %   Platelets 244 150 - 400 K/uL   nRBC 0.0 0.0 - 0.2 %    Comment: Performed at [facility], [address] [place], [place] [address]  Basic metabolic panel per  protocol     Status: Abnormal   Collection Time: 02/13/23  8:37 AM  Result Value Ref Range   Sodium 139 135 - 145 mmol/L   Potassium 3.5 3.5 - 5.1 mmol/L   Chloride 107 98 - 111 mmol/L   CO2 24 22 - 32 mmol/L   Glucose, Bld 102 (H) 70 - 99 mg/dL    Comment: Glucose reference range applies only to samples taken after fasting for at least 8 hours.   BUN 12 8 - 23 mg/dL   Creatinine, Ser 8.41 0.44 - 1.00 mg/dL   Calcium 9.1 8.9 - 32.4 mg/dL   GFR, Estimated >40 >10  mL/min    Comment: (NOTE) Calculated using the CKD-EPI Creatinine Equation (2021)    Anion gap 8 5 - 15    Comment: Performed at [facility], [address] [place], [place] [address]     Complete History and Physical exam available in the office notes  Stephanie Silva Morgana Rowley

## 2023-02-13 NOTE — Progress Notes (Addendum)
For Short Stay: COVID SWAB appointment date:  Bowel Prep reminder: N/A   For Anesthesia: PCP - Dr. Jim Like Cardiologist - N/A  Chest x-ray -  EKG - 02/13/23 Stress Test -  ECHO -  Cardiac Cath -  Pacemaker/ICD device last checked: Pacemaker orders received: Device Rep notified:  Spinal Cord Stimulator: N/A  Sleep Study - N/A CPAP -   Fasting Blood Sugar - N/A Checks Blood Sugar _____ times a day Date and result of last Hgb A1c- N/A Last dose of GLP1 agonist-  GLP1 instructions:   Last dose of SGLT-2 inhibitors- N/A SGLT-2 instructions:   Blood Thinner Instructions:N/A Aspirin Instructions: Last Dose:  Activity level: Can go up a flight of stairs and activities of daily living without stopping and without chest pain and/or shortness of breath   Able to exercise without chest pain and/or shortness of breath  Anesthesia review: Hx: HTN.  Patient denies shortness of breath, fever, cough and chest pain at PAT appointment   Patient verbalized understanding of instructions that were given to them at the PAT appointment. Patient was also instructed that they will need to review over the PAT instructions again at home before surgery.

## 2023-02-14 ENCOUNTER — Other Ambulatory Visit: Payer: Self-pay

## 2023-02-14 ENCOUNTER — Ambulatory Visit (HOSPITAL_BASED_OUTPATIENT_CLINIC_OR_DEPARTMENT_OTHER): Payer: Commercial Managed Care - PPO | Admitting: Anesthesiology

## 2023-02-14 ENCOUNTER — Encounter (HOSPITAL_COMMUNITY)
Admission: RE | Disposition: A | Discharge status: Home / Self Care | Payer: Self-pay | Source: Home / Self Care | Attending: Orthopedic Surgery

## 2023-02-14 ENCOUNTER — Ambulatory Visit (HOSPITAL_COMMUNITY): Payer: Commercial Managed Care - PPO | Admitting: Anesthesiology

## 2023-02-14 ENCOUNTER — Ambulatory Visit (HOSPITAL_COMMUNITY)
Admission: RE | Admit: 2023-02-14 | Discharge: 2023-02-14 | Disposition: A | Discharge status: Home / Self Care | Payer: Commercial Managed Care - PPO | Attending: Orthopedic Surgery | Admitting: Orthopedic Surgery

## 2023-02-14 ENCOUNTER — Encounter (HOSPITAL_COMMUNITY): Payer: Self-pay | Admitting: Orthopedic Surgery

## 2023-02-14 DIAGNOSIS — F419 Anxiety disorder, unspecified: Secondary | ICD-10-CM | POA: Insufficient documentation

## 2023-02-14 DIAGNOSIS — X58XXXA Exposure to other specified factors, initial encounter: Secondary | ICD-10-CM | POA: Diagnosis not present

## 2023-02-14 DIAGNOSIS — M1990 Unspecified osteoarthritis, unspecified site: Secondary | ICD-10-CM | POA: Insufficient documentation

## 2023-02-14 DIAGNOSIS — F410 Panic disorder [episodic paroxysmal anxiety] without agoraphobia: Secondary | ICD-10-CM

## 2023-02-14 DIAGNOSIS — D649 Anemia, unspecified: Secondary | ICD-10-CM

## 2023-02-14 DIAGNOSIS — I10 Essential (primary) hypertension: Secondary | ICD-10-CM

## 2023-02-14 DIAGNOSIS — K219 Gastro-esophageal reflux disease without esophagitis: Secondary | ICD-10-CM | POA: Diagnosis not present

## 2023-02-14 DIAGNOSIS — G4700 Insomnia, unspecified: Secondary | ICD-10-CM | POA: Diagnosis not present

## 2023-02-14 DIAGNOSIS — Z9884 Bariatric surgery status: Secondary | ICD-10-CM | POA: Insufficient documentation

## 2023-02-14 DIAGNOSIS — S52501A Unspecified fracture of the lower end of right radius, initial encounter for closed fracture: Secondary | ICD-10-CM | POA: Insufficient documentation

## 2023-02-14 DIAGNOSIS — G8918 Other acute postprocedural pain: Secondary | ICD-10-CM | POA: Diagnosis not present

## 2023-02-14 DIAGNOSIS — S62101A Fracture of unspecified carpal bone, right wrist, initial encounter for closed fracture: Secondary | ICD-10-CM | POA: Diagnosis not present

## 2023-02-14 DIAGNOSIS — S52551A Other extraarticular fracture of lower end of right radius, initial encounter for closed fracture: Secondary | ICD-10-CM | POA: Diagnosis not present

## 2023-02-14 DIAGNOSIS — Z79899 Other long term (current) drug therapy: Secondary | ICD-10-CM | POA: Diagnosis not present

## 2023-02-14 HISTORY — PX: ORIF WRIST FRACTURE: SHX2133

## 2023-02-14 SURGERY — OPEN REDUCTION INTERNAL FIXATION (ORIF) WRIST FRACTURE
Anesthesia: General | Site: Wrist | Laterality: Right

## 2023-02-14 MED ORDER — PHENYLEPHRINE 80 MCG/ML (10ML) SYRINGE FOR IV PUSH (FOR BLOOD PRESSURE SUPPORT)
PREFILLED_SYRINGE | INTRAVENOUS | Status: AC
  Filled 2023-02-14: qty 10

## 2023-02-14 MED ORDER — SCOPOLAMINE 1 MG/3DAYS TD PT72
MEDICATED_PATCH | TRANSDERMAL | Status: AC
  Filled 2023-02-14: qty 1

## 2023-02-14 MED ORDER — PHENYLEPHRINE 80 MCG/ML (10ML) SYRINGE FOR IV PUSH (FOR BLOOD PRESSURE SUPPORT)
PREFILLED_SYRINGE | INTRAVENOUS | Status: DC | PRN
  Administered 2023-02-14: 80 ug via INTRAVENOUS

## 2023-02-14 MED ORDER — SCOPOLAMINE 1 MG/3DAYS TD PT72
MEDICATED_PATCH | TRANSDERMAL | Status: DC | PRN
  Administered 2023-02-14: 1 via TRANSDERMAL

## 2023-02-14 MED ORDER — BUPIVACAINE LIPOSOME 1.3 % IJ SUSP
INTRAMUSCULAR | Status: DC | PRN
  Administered 2023-02-14: 10 mL via PERINEURAL

## 2023-02-14 MED ORDER — FENTANYL CITRATE (PF) 100 MCG/2ML IJ SOLN
INTRAMUSCULAR | Status: AC
  Filled 2023-02-14: qty 2

## 2023-02-14 MED ORDER — MIDAZOLAM HCL 2 MG/2ML IJ SOLN
INTRAMUSCULAR | Status: DC | PRN
  Administered 2023-02-14: 1 mg via INTRAVENOUS

## 2023-02-14 MED ORDER — ORAL CARE MOUTH RINSE: 15.0000 mL | Freq: Once | OROMUCOSAL | Status: AC

## 2023-02-14 MED ORDER — AMISULPRIDE (ANTIEMETIC) 5 MG/2ML IV SOLN: 10.0000 mg | Freq: Once | INTRAVENOUS | Status: DC | PRN

## 2023-02-14 MED ORDER — ONDANSETRON HCL 4 MG/2ML IJ SOLN
INTRAMUSCULAR | Status: AC
  Filled 2023-02-14: qty 2

## 2023-02-14 MED ORDER — ONDANSETRON HCL 4 MG/2ML IJ SOLN: 4.0000 mg | Freq: Once | INTRAMUSCULAR | Status: DC | PRN

## 2023-02-14 MED ORDER — EPHEDRINE 5 MG/ML INJ
INTRAVENOUS | Status: AC
  Filled 2023-02-14: qty 5

## 2023-02-14 MED ORDER — HYDROMORPHONE HCL 1 MG/ML IJ SOLN: 0.2500 mg | INTRAMUSCULAR | Status: DC | PRN

## 2023-02-14 MED ORDER — PROPOFOL 10 MG/ML IV BOLUS
INTRAVENOUS | Status: DC | PRN
  Administered 2023-02-14: 150 mg via INTRAVENOUS

## 2023-02-14 MED ORDER — LACTATED RINGERS IV SOLN: INTRAVENOUS | Status: DC

## 2023-02-14 MED ORDER — OXYCODONE HCL 5 MG/5ML PO SOLN: 5.0000 mg | Freq: Once | ORAL | Status: DC | PRN

## 2023-02-14 MED ORDER — DEXAMETHASONE SODIUM PHOSPHATE 10 MG/ML IJ SOLN
INTRAMUSCULAR | Status: DC | PRN
  Administered 2023-02-14: 8 mg via INTRAVENOUS

## 2023-02-14 MED ORDER — CHLORHEXIDINE GLUCONATE 0.12 % MT SOLN
15.0000 mL | Freq: Once | OROMUCOSAL | Status: AC
  Administered 2023-02-14: 15 mL via OROMUCOSAL

## 2023-02-14 MED ORDER — MIDAZOLAM HCL 2 MG/2ML IJ SOLN
1.0000 mg | Freq: Once | INTRAMUSCULAR | Status: DC
  Filled 2023-02-14: qty 2

## 2023-02-14 MED ORDER — CEFAZOLIN SODIUM-DEXTROSE 2-4 GM/100ML-% IV SOLN
2.0000 g | INTRAVENOUS | Status: AC
  Administered 2023-02-14: 2 g via INTRAVENOUS
  Filled 2023-02-14: qty 100

## 2023-02-14 MED ORDER — ONDANSETRON HCL 4 MG/2ML IJ SOLN
INTRAMUSCULAR | Status: DC | PRN
  Administered 2023-02-14: 4 mg via INTRAVENOUS

## 2023-02-14 MED ORDER — LIDOCAINE HCL (PF) 2 % IJ SOLN
INTRAMUSCULAR | Status: AC
  Filled 2023-02-14: qty 5

## 2023-02-14 MED ORDER — BUPIVACAINE HCL (PF) 0.5 % IJ SOLN
INTRAMUSCULAR | Status: DC | PRN
  Administered 2023-02-14: 15 mL via PERINEURAL

## 2023-02-14 MED ORDER — DEXAMETHASONE SODIUM PHOSPHATE 10 MG/ML IJ SOLN
INTRAMUSCULAR | Status: AC
  Filled 2023-02-14: qty 1

## 2023-02-14 MED ORDER — 0.9 % SODIUM CHLORIDE (POUR BTL) OPTIME
TOPICAL | Status: DC | PRN
  Administered 2023-02-14: 1000 mL

## 2023-02-14 MED ORDER — BUPIVACAINE HCL 0.25 % IJ SOLN
INTRAMUSCULAR | Status: AC
  Filled 2023-02-14: qty 1

## 2023-02-14 MED ORDER — BACITRACIN ZINC 500 UNIT/GM EX OINT
TOPICAL_OINTMENT | CUTANEOUS | Status: AC
  Filled 2023-02-14: qty 28.35

## 2023-02-14 MED ORDER — EPHEDRINE SULFATE-NACL 50-0.9 MG/10ML-% IV SOSY
PREFILLED_SYRINGE | INTRAVENOUS | Status: DC | PRN
  Administered 2023-02-14: 10 mg via INTRAVENOUS

## 2023-02-14 MED ORDER — OXYCODONE HCL 5 MG PO TABS: 5.0000 mg | ORAL_TABLET | Freq: Once | ORAL | Status: DC | PRN

## 2023-02-14 MED ORDER — MIDAZOLAM HCL 2 MG/2ML IJ SOLN
INTRAMUSCULAR | Status: AC
  Filled 2023-02-14: qty 2

## 2023-02-14 MED ORDER — FENTANYL CITRATE PF 50 MCG/ML IJ SOSY
50.0000 ug | PREFILLED_SYRINGE | Freq: Once | INTRAMUSCULAR | Status: AC
  Administered 2023-02-14: 50 ug via INTRAVENOUS
  Filled 2023-02-14 (×2): qty 2

## 2023-02-14 MED ORDER — LIDOCAINE 2% (20 MG/ML) 5 ML SYRINGE
INTRAMUSCULAR | Status: DC | PRN
  Administered 2023-02-14: 60 mg via INTRAVENOUS

## 2023-02-14 MED ORDER — PROPOFOL 10 MG/ML IV BOLUS
INTRAVENOUS | Status: AC
  Filled 2023-02-14: qty 20

## 2023-02-14 SURGICAL SUPPLY — 64 items
BAG COUNTER SPONGE SURGICOUNT (BAG) IMPLANT
BAG SPEC THK2 15X12 ZIP CLS (MISCELLANEOUS) ×1
BAG SPNG CNTER NS LX DISP (BAG)
BAG ZIPLOCK 12X15 (MISCELLANEOUS) ×1 IMPLANT
BIT DRILL 2.2 SS TIBIAL (BIT) IMPLANT
BNDG CMPR MED 10X6 ELC LF (GAUZE/BANDAGES/DRESSINGS) ×1
BNDG ELASTIC 6X10 VLCR STRL LF (GAUZE/BANDAGES/DRESSINGS) IMPLANT
BNDG GAUZE DERMACEA FLUFF 4 (GAUZE/BANDAGES/DRESSINGS) ×1 IMPLANT
BNDG GZE DERMACEA 4 6PLY (GAUZE/BANDAGES/DRESSINGS) ×1
COVER SURGICAL LIGHT HANDLE (MISCELLANEOUS) ×1 IMPLANT
CUFF TOURN SGL QUICK 18X4 (TOURNIQUET CUFF) ×1 IMPLANT
DRAPE OEC MINIVIEW 54X84 (DRAPES) ×1 IMPLANT
DRAPE U-SHAPE 47X51 STRL (DRAPES) ×1 IMPLANT
ELECT REM PT RETURN 15FT ADLT (MISCELLANEOUS) ×1 IMPLANT
EVACUATOR 1/8 PVC DRAIN (DRAIN) IMPLANT
GAUZE 4X4 16PLY ~~LOC~~+RFID DBL (SPONGE) ×2 IMPLANT
GAUZE PAD ABD 8X10 STRL (GAUZE/BANDAGES/DRESSINGS) ×1 IMPLANT
GAUZE SPONGE 4X4 12PLY STRL (GAUZE/BANDAGES/DRESSINGS) ×1 IMPLANT
GLOVE BIO SURGEON STRL SZ7 (GLOVE) ×1 IMPLANT
GLOVE BIO SURGEON STRL SZ7.5 (GLOVE) ×2 IMPLANT
GLOVE BIO SURGEON STRL SZ8 (GLOVE) ×1 IMPLANT
GLOVE BIOGEL PI IND STRL 7.5 (GLOVE) ×1 IMPLANT
GOWN STRL REUS W/ TWL LRG LVL3 (GOWN DISPOSABLE) ×1 IMPLANT
GOWN STRL REUS W/TWL LRG LVL3 (GOWN DISPOSABLE) ×1
K-WIRE DBL TROCAR .062X4 (WIRE)
KIT BASIN OR (CUSTOM PROCEDURE TRAY) ×1 IMPLANT
KIT TURNOVER KIT A (KITS) IMPLANT
KWIRE DBL TROCAR .062X4 (WIRE) IMPLANT
MANIFOLD NEPTUNE II (INSTRUMENTS) ×1 IMPLANT
NS IRRIG 1000ML POUR BTL (IV SOLUTION) ×1 IMPLANT
PACK ORTHO EXTREMITY (CUSTOM PROCEDURE TRAY) ×1 IMPLANT
PAD CAST 3X4 CTTN HI CHSV (CAST SUPPLIES) ×1 IMPLANT
PAD CAST 4YDX4 CTTN HI CHSV (CAST SUPPLIES) ×1 IMPLANT
PADDING CAST ABS COTTON 6X4 NS (CAST SUPPLIES) IMPLANT
PADDING CAST COTTON 3X4 STRL (CAST SUPPLIES) ×2
PADDING CAST COTTON 4X4 STRL (CAST SUPPLIES)
PEG LOCKING SMOOTH 2.2X16 (Screw) IMPLANT
PEG LOCKING SMOOTH 2.2X18 (Peg) IMPLANT
PENCIL SMOKE EVACUATOR (MISCELLANEOUS) IMPLANT
PLATE CROSSLOCK NAR MINI RT (Plate) IMPLANT
PROTECTOR NERVE ULNAR (MISCELLANEOUS) ×1 IMPLANT
SCREW BN 14X2.7XNONLOCK 3 LD (Screw) IMPLANT
SCREW LOCK 14X2.7X 3 LD TPR (Screw) IMPLANT
SCREW LOCKING 2.7X14 (Screw) ×2 IMPLANT
SCREW LP NL 2.7X15MM (Screw) IMPLANT
SCREW NLOCK 2.7X14 (Screw) ×1 IMPLANT
SPIKE FLUID TRANSFER (MISCELLANEOUS) ×1 IMPLANT
SPLINT FIBERGLASS 3X35 (CAST SUPPLIES) IMPLANT
SUT BONE WAX W31G (SUTURE) ×1 IMPLANT
SUT ETHILON 6 0 PS 3 18 (SUTURE) ×1 IMPLANT
SUT MERSILENE 4 0 P 3 (SUTURE) ×1 IMPLANT
SUT MNCRL AB 4-0 PS2 18 (SUTURE) ×1 IMPLANT
SUT PROLENE 3 0 PS 2 (SUTURE) ×1 IMPLANT
SUT PROLENE 4 0 P 3 18 (SUTURE) ×1 IMPLANT
SUT PROLENE 4 0 RB 1 (SUTURE)
SUT PROLENE 4-0 RB1 .5 CRCL 36 (SUTURE) ×1 IMPLANT
SUT VIC AB 0 CT1 27 (SUTURE)
SUT VIC AB 0 CT1 27XBRD ANTBC (SUTURE) ×2 IMPLANT
SUT VIC AB 2-0 CT1 27 (SUTURE)
SUT VIC AB 2-0 CT1 TAPERPNT 27 (SUTURE) ×1 IMPLANT
SUT VICRYL RAPIDE 4/0 PS 2 (SUTURE) IMPLANT
TOWEL OR 17X26 10 PK STRL BLUE (TOWEL DISPOSABLE) ×1 IMPLANT
UNDERPAD 30X36 HEAVY ABSORB (UNDERPADS AND DIAPERS) ×1 IMPLANT
WATER STERILE IRR 1000ML POUR (IV SOLUTION) ×1 IMPLANT

## 2023-02-14 NOTE — Anesthesia Procedure Notes (Signed)
Procedure Name: LMA Insertion Date/Time: 02/14/2023 4:19 PM  Performed by: Nelle Don, CRNAPre-anesthesia Checklist: Patient identified, Emergency Drugs available, Suction available and Patient being monitored Patient Re-evaluated:Patient Re-evaluated prior to induction Oxygen Delivery Method: Circle system utilized Preoxygenation: Pre-oxygenation with 100% oxygen Induction Type: IV induction LMA: LMA inserted LMA Size: 4.0 Number of attempts: 1 Dental Injury: Teeth and Oropharynx as per pre-operative assessment

## 2023-02-14 NOTE — Anesthesia Postprocedure Evaluation (Signed)
Anesthesia Post Note  Patient: Stephanie Silva  Procedure(s) Performed: OPEN REDUCTION INTERNAL FIXATION (ORIF) WRIST FRACTURE (Right: Wrist)     Patient location during evaluation: PACU Anesthesia Type: General Level of consciousness: awake and alert and oriented Pain management: pain level controlled Vital Signs Assessment: post-procedure vital signs reviewed and stable Respiratory status: spontaneous breathing, nonlabored ventilation and respiratory function stable Cardiovascular status: blood pressure returned to baseline and stable Postop Assessment: no apparent nausea or vomiting Anesthetic complications: no   No notable events documented.  Last Vitals:  Vitals:   02/14/23 1725 02/14/23 1730  BP:    Pulse: 79 82  Resp: 15 17  Temp:    SpO2: 100% 100%    Last Pain:  Vitals:   02/14/23 1715  TempSrc:   PainSc: 0-No pain                 Hillari Zumwalt A.

## 2023-02-14 NOTE — Interval H&P Note (Signed)
History and Physical Interval Note:  02/14/2023 3:44 PM  Stephanie Silva  has presented today for surgery, with the diagnosis of Right wrist fracture.  The various methods of treatment have been discussed with the patient and family. After consideration of risks, benefits and other options for treatment, the patient has consented to  Procedure(s) with comments: OPEN REDUCTION INTERNAL FIXATION (ORIF) WRIST FRACTURE (Right) - block and MAC 60 min as a surgical intervention.  The patient's history has been reviewed, patient examined, no change in status, stable for surgery.  I have reviewed the patient's chart and labs.  Questions were answered to the patient's satisfaction.     [name]

## 2023-02-14 NOTE — Transfer of Care (Signed)
Immediate Anesthesia Transfer of Care Note  Patient: Stephanie Silva  Procedure(s) Performed: OPEN REDUCTION INTERNAL FIXATION (ORIF) WRIST FRACTURE (Right: Wrist)  Patient Location: PACU  Anesthesia Type:General  Level of Consciousness: awake, alert , and oriented  Airway & Oxygen Therapy: Patient Spontanous Breathing and Patient connected to face mask oxygen  Post-op Assessment: Report given to RN and Post -op Vital signs reviewed and stable  Post vital signs: Reviewed and stable  Last Vitals:  Vitals Value Taken Time  BP 117/81   Temp    Pulse 86   Resp 13 02/14/23 1713  SpO2 100   Vitals shown include unvalidated device data.  Last Pain:  Vitals:   02/14/23 1545  TempSrc:   PainSc: 1       Patients Stated Pain Goal: 3 (02/14/23 1321)  Complications: No notable events documented.

## 2023-02-14 NOTE — Discharge Instructions (Signed)
  Orthopaedic Hand Surgery Discharge Instructions  WEIGHT BEARING STATUS: Non weight bearing on operative extremity  DRESSING CARE: Please keep your dressing/splint/cast clean and dry until your follow-up appointment. You may shower by placing a waterproof covering over your dressing/splint/cast. Contact your surgeon if your splint/cast gets wet. It will need to be changed to prevent skin breakdown.  PAIN CONTROL: First line medications for post operative pain control are Tylenol (acetaminophen) and Motrin (ibuprofen) if you are able to take these medications. If you have been prescribed a medication these can be taken as breakthrough pain medications. Please note that some narcotic pain medication has acetaminophen added and you should never consume more than 4,000mg of acetaminophen in 24-hour period. Please note that if you are given Toradol (ketorolac) you should not take similar medications such as ibuprofen or naproxen.  DISCHARGE MEDICATIONS: If you have been prescribed medication it was sent electronically to your pharmacy. No changes have been made to your home medications.  ICE/ELEVATION: Ice and elevate your injured extremity as needed. Avoid direct contact of ice with skin.   BANDAGE FEELS TOO TIGHT: If your bandage feels too tight, first make sure you are elevating your fingers as much as possible. The outer layer of the bandage can be unwrapped and reapplied more loosely. If no improvement, you may carefully cut the inner layer longitudinally until the pressure has resolved and then rewrap the outer layer. If you are not comfortable with these instructions, please call the office and the bandage can be changed for you.   FOLLOW UP: You will be called after surgery with an appointment date and time, however if you have not received a phone call within 3 days, please call during regular office hours at [phone] to schedule a post operative appointment.  Please Seek Medical Attention  if: Call MD for: pain or pressure in chest, jaw, arm, back, neck  Call MD for: temperature greater than 101 F for more than 24 hrs Call MD for: difficulty breathing Call MD for: incision redness, bleeding, drainage  Call MD for: palpitations or feeling that the heart is racing  Call MD for: increased swelling in arm, leg, ankle, or abdomen  Call MD for: lightheadedness, dizziness, fainting Call 911 or go to ER for any medical emergency if you are not able to get in touch with your doctor   J. Reid Braydon Kullman, MD Orthopaedic Hand Surgeon [facility] Office number: [phone] [address] [place], [place] [address]  

## 2023-02-14 NOTE — Anesthesia Procedure Notes (Addendum)
Anesthesia Regional Block: Interscalene brachial plexus block   Pre-Anesthetic Checklist: , timeout performed,  Correct Patient, Correct Site, Correct Laterality,  Correct Procedure, Correct Position, site marked,  Risks and benefits discussed,  Surgical consent,  Pre-op evaluation,  At surgeon's request and post-op pain management  Laterality: Right  Prep: chloraprep       Needles:  Injection technique: Single-shot  Needle Type: Echogenic Stimulator Needle     Needle Length: 10cm  Needle Gauge: 21   Needle insertion depth: 6 cm   Additional Needles:   Procedures:,,,, ultrasound used (permanent image in chart),,   Motor weakness within 5 minutes.  Narrative:  Start time: 02/14/2023 3:35 PM End time: 02/14/2023 3:40 PM Injection made incrementally with aspirations every 5 mL.  Performed by: Personally  Anesthesiologist: Mal Amabile, MD  Additional Notes: Timeout performed. Patient sedated. Relevant anatomy ID'd using [place]. Incremental 2-16ml injection of LA with frequent aspiration. Patient tolerated procedure well.     Right Interscalene Block

## 2023-02-14 NOTE — Op Note (Signed)
OPERATIVE NOTE  DATE OF PROCEDURE: 02/14/2023  SURGEONS: Primary: [name], MD  ASSISTANT: Payton Mccallum, PA-C  Due to the complexity of the surgery an assistant was necessary to aid in retraction, exposure, limb positioning, closure and dressing application. The use of an assistant on this case follows CMS and CPT guidelines, which allows an assistant to be used because of the complexity level of this case.   PREOPERATIVE DIAGNOSIS: Right wrist fracture  POSTOPERATIVE DIAGNOSIS: Same  NAME OF PROCEDURE:    Right distal radius open reduction internal fixation, extra- articular, 2 fragments 2.    Right wrist brachioradialis tenotomy  3.    Right wrist radiographs four views with intraoperative interpretation  ANESTHESIA: Regional Block + MAC  SKIN PREPARATION: Hibiclens  ESTIMATED BLOOD LOSS: Minimal  IMPLANTS: [employer] DVR Crosslock volar plate and screws  Implant Name Type Inv. Item Serial No. Manufacturer Lot No. LRB No. Used Action  PLATE CROSSLOCK NAR MINI RT - [id number] Plate PLATE CROSSLOCK NAR MINI RT  [employer](ORTH,TRAU,BIO,SG)  Right 1 Implanted  PEG LOCKING SMOOTH 2.2X18 - [id number] Peg PEG LOCKING SMOOTH 2.2X18  [employer](ORTH,TRAU,BIO,SG)  Right 5 Implanted  PEG LOCKING SMOOTH 2.2X16 - [id number] Screw PEG LOCKING SMOOTH 2.2X16  [employer](ORTH,TRAU,BIO,SG)  Right 1 Implanted  SCREW LOCKING 2.7X14 - [id number] Screw SCREW LOCKING 2.7X14  [employer](ORTH,TRAU,BIO,SG)  Right 2 Implanted  SCREW NLOCK 2.7X14 - [id number] Screw SCREW NLOCK 2.7X14  [employer](ORTH,TRAU,BIO,SG)  Right 1 Implanted   INDICATIONS:  Stephanie Silva is a 62 y.o. female who has the above preoperative diagnosis. The patient has decided to proceed with surgical intervention.  Risks, benefits and alternatives of operative management were discussed including, but not limited to, risks of anesthesia complications, infection, pain, persistent symptoms, stiffness, need for future surgery.  The  patient understands, agrees and elects to proceed with surgery.    DESCRIPTION OF PROCEDURE: The patient was met in the pre-operative area and their identity was verified.  The operative location and laterality was also verified and marked.  The patient was brought to the OR and was placed supine on the table.  After repeat patient identification with the operative team anesthesia was provided and the patient was prepped and draped in the usual sterile fashion.  A final timeout was performed verifying the correction patient, procedure, location and laterality.  Preoperative antibiotics were administered. The Right upper extremity was exsanguinated with an Esmarch and tourniquet inflated to . Under loupe magnification, an incision was made directly over the flexor carpi radialis (FCR) tendon. Bipolar was utilized for hemostasis. The roof of the FCR tendon sheath was incised. The FCR tendon was then retracted ulnarly to protect the palmar cutaneous branch of the median nerve. Subsequently, the floor of the FCR tendon sheath was incised over the distal end of the radius.  The flexor pollicis longus (FPL) was swept ulnarly to reveal the pronator quadratus.  The pronator quadratus fascia was incised from its distal and radial borders. A periosteal elevator was utilized to mobilize the pronator quadratus muscle off the distal radius.  The fracture site was irrigated and prepared for reduction with a freer and adson forceps. There were 2 distal radius fracture fragments. The brachioradialis tendon insertion was released to facilitate reduction.  This release was performed by identifying the broad insertion of the brachiradialis tendon and also identifying the 1st dorsal compartment tendons.  The 1st dorsal compartment tendons were protected, the broad tendon insertion was released under direct visualization. The fracture was reduced and provisionally  fixed with K-wires. We then selected a proper length and width  volar plate.  The plate was placed on the distal end of the radius with the fracture reduced and secured to the bone. Using mini C-arm the fracture reduction and position of the plate were deemed to be satisfactory.  We proceeded with securing the plate to the radius with the 1 bicortical nonlocking screw in the oblong portion of the shaft.  With the intermediate column reduced, we secured the distal end of the plate with 2 screws in the distal ulnar portion of plate.  We again used the C-arm to verify satisfactory plate position as well as fracture reduction. The radial column was then reduced and radial styloid locking screws were placed. The remainder shaft screws were placed through the plate. We used the mini C-arm to verify satisfactory plate position, screw lengths and fracture reduction. The DRUJ was then tested in neutral, pronation and supination and was found to be stable.The tourniquet was deflated. Meticulous hemostasis was obtained. The incision was copiously irrigated with normal saline and closed with interrupted 4-0 absorbable horizontal mattress sutures. The incision was covered with xeroform, sterile guaze, webril and well padded short arm splint. The fingers were pink, warm and well perfused. All counts were correct. The patient was awoken from anesthesia and brought to PACU for recovery in stable condition.   Philipp Ovens, MD Orthopaedic Hand Surgery

## 2023-02-14 NOTE — Anesthesia Preprocedure Evaluation (Addendum)
Anesthesia Evaluation  Patient identified by MRN, date of birth, ID band Patient awake    Reviewed: Allergy & Precautions, NPO status , Patient's Chart, lab work & pertinent test results, reviewed documented beta blocker date and time   History of Anesthesia Complications (+) PONV and history of anesthetic complications  Airway Mallampati: II  TM Distance: >3 FB Neck ROM: Full    Dental  (+) Teeth Intact, Caps, Dental Advisory Given, Chipped,    Pulmonary neg pulmonary ROS   Pulmonary exam normal breath sounds clear to auscultation       Cardiovascular hypertension, Pt. on medications Normal cardiovascular exam Rhythm:Regular Rate:Normal  EKG 02/13/23 NSR, Normal   Neuro/Psych  Headaches PSYCHIATRIC DISORDERS Anxiety     Panic attacksHx/o Bell's palsy  Neuromuscular disease    GI/Hepatic Neg liver ROS,GERD  Medicated,,Hx/o gastric bypass   Endo/Other  negative endocrine ROS    Renal/GU negative Renal ROS  negative genitourinary   Musculoskeletal  (+) Arthritis , Osteoarthritis,  Osteoporosis Scoliosis   Abdominal   Peds  Hematology  (+) Blood dyscrasia, anemia   Anesthesia Other Findings   Reproductive/Obstetrics                             Anesthesia Physical Anesthesia Plan  ASA: 2  Anesthesia Plan: General   Post-op Pain Management: Minimal or no pain anticipated   Induction: Intravenous  PONV Risk Score and Plan: 4 or greater and Treatment may vary due to age or medical condition, Propofol infusion, Ondansetron and Midazolam  Airway Management Planned: LMA  Additional Equipment: None  Intra-op Plan:   Post-operative Plan: Extubation in OR  Informed Consent: I have reviewed the patients History and Physical, chart, labs and discussed the procedure including the risks, benefits and alternatives for the proposed anesthesia with the patient or authorized representative who  has indicated his/her understanding and acceptance.     Dental advisory given  Plan Discussed with: CRNA and Anesthesiologist  Anesthesia Plan Comments:         Anesthesia Quick Evaluation

## 2023-02-18 ENCOUNTER — Encounter (HOSPITAL_COMMUNITY): Payer: Self-pay | Admitting: Orthopedic Surgery

## 2023-02-18 ENCOUNTER — Other Ambulatory Visit (HOSPITAL_COMMUNITY): Payer: Self-pay

## 2023-02-18 MED ORDER — METHOCARBAMOL 750 MG PO TABS
750.0000 mg | ORAL_TABLET | Freq: Three times a day (TID) | ORAL | 3 refills | Status: DC
  Filled 2023-02-18: qty 90, 30d supply, fill #0
  Filled 2023-03-21: qty 90, 30d supply, fill #1
  Filled 2023-04-18: qty 90, 30d supply, fill #2
  Filled 2023-05-20: qty 90, 30d supply, fill #3

## 2023-02-19 ENCOUNTER — Other Ambulatory Visit (HOSPITAL_COMMUNITY): Payer: Self-pay

## 2023-02-25 ENCOUNTER — Other Ambulatory Visit (HOSPITAL_COMMUNITY): Payer: Self-pay

## 2023-02-25 DIAGNOSIS — H524 Presbyopia: Secondary | ICD-10-CM | POA: Diagnosis not present

## 2023-02-25 DIAGNOSIS — S52501A Unspecified fracture of the lower end of right radius, initial encounter for closed fracture: Secondary | ICD-10-CM | POA: Diagnosis not present

## 2023-02-26 ENCOUNTER — Other Ambulatory Visit (HOSPITAL_COMMUNITY): Payer: Self-pay

## 2023-03-07 ENCOUNTER — Other Ambulatory Visit (HOSPITAL_COMMUNITY): Payer: Self-pay

## 2023-03-08 ENCOUNTER — Other Ambulatory Visit (HOSPITAL_COMMUNITY): Payer: Self-pay

## 2023-03-08 MED ORDER — HYDROCODONE-ACETAMINOPHEN 10-325 MG PO TABS
ORAL_TABLET | ORAL | 0 refills | Status: DC
  Filled 2023-03-08: qty 150, 25d supply, fill #0

## 2023-03-11 ENCOUNTER — Other Ambulatory Visit: Payer: Self-pay | Admitting: Gastroenterology

## 2023-03-11 ENCOUNTER — Other Ambulatory Visit: Payer: Self-pay

## 2023-03-11 ENCOUNTER — Other Ambulatory Visit (HOSPITAL_BASED_OUTPATIENT_CLINIC_OR_DEPARTMENT_OTHER): Payer: Self-pay

## 2023-03-12 ENCOUNTER — Other Ambulatory Visit (HOSPITAL_COMMUNITY): Payer: Self-pay

## 2023-03-12 ENCOUNTER — Other Ambulatory Visit (HOSPITAL_BASED_OUTPATIENT_CLINIC_OR_DEPARTMENT_OTHER): Payer: Self-pay

## 2023-03-12 MED ORDER — AMOXICILLIN 500 MG PO CAPS
500.0000 mg | ORAL_CAPSULE | Freq: Three times a day (TID) | ORAL | 0 refills | Status: AC
  Filled 2023-03-12: qty 21, 7d supply, fill #0

## 2023-03-13 ENCOUNTER — Other Ambulatory Visit (HOSPITAL_COMMUNITY): Payer: Self-pay

## 2023-03-13 ENCOUNTER — Other Ambulatory Visit: Payer: Self-pay

## 2023-03-13 MED ORDER — PREGABALIN 150 MG PO CAPS
150.0000 mg | ORAL_CAPSULE | Freq: Two times a day (BID) | ORAL | 0 refills | Status: DC
  Filled 2023-03-13: qty 180, 90d supply, fill #0

## 2023-03-13 MED ORDER — LORAZEPAM 0.5 MG PO TABS
0.5000 mg | ORAL_TABLET | Freq: Three times a day (TID) | ORAL | 0 refills | Status: DC | PRN
  Filled 2023-03-13: qty 90, 30d supply, fill #0

## 2023-03-21 ENCOUNTER — Other Ambulatory Visit (HOSPITAL_COMMUNITY): Payer: Self-pay

## 2023-04-01 ENCOUNTER — Other Ambulatory Visit: Payer: Self-pay | Admitting: Gastroenterology

## 2023-04-01 ENCOUNTER — Other Ambulatory Visit: Payer: Self-pay

## 2023-04-01 ENCOUNTER — Other Ambulatory Visit (HOSPITAL_COMMUNITY): Payer: Self-pay

## 2023-04-01 DIAGNOSIS — S52501A Unspecified fracture of the lower end of right radius, initial encounter for closed fracture: Secondary | ICD-10-CM | POA: Diagnosis not present

## 2023-04-01 DIAGNOSIS — R1310 Dysphagia, unspecified: Secondary | ICD-10-CM

## 2023-04-01 MED ORDER — METOCLOPRAMIDE HCL 5 MG PO TABS
5.0000 mg | ORAL_TABLET | Freq: Every day | ORAL | 0 refills | Status: DC
Start: 2023-04-01 — End: 2023-05-20
  Filled 2023-04-01: qty 30, 30d supply, fill #0

## 2023-04-01 MED ORDER — PANTOPRAZOLE SODIUM 40 MG PO TBEC
40.0000 mg | DELAYED_RELEASE_TABLET | Freq: Two times a day (BID) | ORAL | 0 refills | Status: DC
  Filled 2023-04-01: qty 60, 30d supply, fill #0

## 2023-04-02 ENCOUNTER — Other Ambulatory Visit (HOSPITAL_COMMUNITY): Payer: Self-pay

## 2023-04-05 DIAGNOSIS — M810 Age-related osteoporosis without current pathological fracture: Secondary | ICD-10-CM | POA: Diagnosis not present

## 2023-04-05 DIAGNOSIS — K219 Gastro-esophageal reflux disease without esophagitis: Secondary | ICD-10-CM | POA: Diagnosis not present

## 2023-04-05 DIAGNOSIS — Z23 Encounter for immunization: Secondary | ICD-10-CM | POA: Diagnosis not present

## 2023-04-05 DIAGNOSIS — R400 Somnolence: Secondary | ICD-10-CM | POA: Diagnosis not present

## 2023-04-05 DIAGNOSIS — M419 Scoliosis, unspecified: Secondary | ICD-10-CM | POA: Diagnosis not present

## 2023-04-05 DIAGNOSIS — G500 Trigeminal neuralgia: Secondary | ICD-10-CM | POA: Diagnosis not present

## 2023-04-05 DIAGNOSIS — I10 Essential (primary) hypertension: Secondary | ICD-10-CM | POA: Diagnosis not present

## 2023-04-05 DIAGNOSIS — Z0000 Encounter for general adult medical examination without abnormal findings: Secondary | ICD-10-CM | POA: Diagnosis not present

## 2023-04-05 DIAGNOSIS — G510 Bell's palsy: Secondary | ICD-10-CM | POA: Diagnosis not present

## 2023-04-05 DIAGNOSIS — Z6830 Body mass index (BMI) 30.0-30.9, adult: Secondary | ICD-10-CM | POA: Diagnosis not present

## 2023-04-12 ENCOUNTER — Other Ambulatory Visit (HOSPITAL_COMMUNITY): Payer: Self-pay

## 2023-04-15 ENCOUNTER — Other Ambulatory Visit (HOSPITAL_COMMUNITY): Payer: Self-pay

## 2023-04-15 MED ORDER — HYDROCODONE-ACETAMINOPHEN 10-325 MG PO TABS
1.0000 | ORAL_TABLET | ORAL | 0 refills | Status: DC | PRN
  Filled 2023-04-15: qty 150, 25d supply, fill #0

## 2023-04-16 ENCOUNTER — Other Ambulatory Visit (HOSPITAL_COMMUNITY): Payer: Self-pay

## 2023-04-17 ENCOUNTER — Other Ambulatory Visit (HOSPITAL_COMMUNITY): Payer: Self-pay

## 2023-04-17 ENCOUNTER — Other Ambulatory Visit: Payer: Self-pay

## 2023-04-17 MED ORDER — LORAZEPAM 0.5 MG PO TABS
0.5000 mg | ORAL_TABLET | Freq: Three times a day (TID) | ORAL | 0 refills | Status: DC | PRN
  Filled 2023-04-17: qty 90, 30d supply, fill #0

## 2023-04-19 ENCOUNTER — Other Ambulatory Visit (HOSPITAL_COMMUNITY): Payer: Self-pay

## 2023-05-01 DIAGNOSIS — S52501D Unspecified fracture of the lower end of right radius, subsequent encounter for closed fracture with routine healing: Secondary | ICD-10-CM | POA: Diagnosis not present

## 2023-05-03 ENCOUNTER — Other Ambulatory Visit: Payer: Self-pay | Admitting: Gastroenterology

## 2023-05-03 DIAGNOSIS — R1310 Dysphagia, unspecified: Secondary | ICD-10-CM

## 2023-05-04 ENCOUNTER — Other Ambulatory Visit (HOSPITAL_COMMUNITY): Payer: Self-pay

## 2023-05-20 ENCOUNTER — Other Ambulatory Visit: Payer: Self-pay | Admitting: Gastroenterology

## 2023-05-20 ENCOUNTER — Other Ambulatory Visit (HOSPITAL_COMMUNITY): Payer: Self-pay

## 2023-05-20 DIAGNOSIS — R1310 Dysphagia, unspecified: Secondary | ICD-10-CM

## 2023-05-21 ENCOUNTER — Other Ambulatory Visit: Payer: Self-pay

## 2023-05-21 ENCOUNTER — Other Ambulatory Visit (HOSPITAL_BASED_OUTPATIENT_CLINIC_OR_DEPARTMENT_OTHER): Payer: Self-pay

## 2023-05-21 ENCOUNTER — Other Ambulatory Visit (HOSPITAL_COMMUNITY): Payer: Self-pay

## 2023-05-21 MED ORDER — HYDROCODONE-ACETAMINOPHEN 10-325 MG PO TABS
1.0000 | ORAL_TABLET | ORAL | 0 refills | Status: DC | PRN
  Filled 2023-05-21: qty 150, 25d supply, fill #0

## 2023-05-21 MED ORDER — METOCLOPRAMIDE HCL 5 MG PO TABS
5.0000 mg | ORAL_TABLET | Freq: Every day | ORAL | 1 refills | Status: DC
Start: 2023-05-21 — End: 2023-08-01
  Filled 2023-05-21: qty 30, 30d supply, fill #0
  Filled 2023-07-03: qty 30, 30d supply, fill #1

## 2023-05-23 ENCOUNTER — Other Ambulatory Visit (HOSPITAL_COMMUNITY): Payer: Self-pay

## 2023-05-24 ENCOUNTER — Other Ambulatory Visit: Payer: Self-pay

## 2023-05-24 ENCOUNTER — Other Ambulatory Visit (HOSPITAL_COMMUNITY): Payer: Self-pay

## 2023-05-24 MED ORDER — LORAZEPAM 0.5 MG PO TABS
0.5000 mg | ORAL_TABLET | Freq: Three times a day (TID) | ORAL | 0 refills | Status: DC | PRN
  Filled 2023-05-24: qty 90, 30d supply, fill #0

## 2023-05-28 ENCOUNTER — Other Ambulatory Visit (HOSPITAL_COMMUNITY): Payer: Self-pay

## 2023-06-14 ENCOUNTER — Other Ambulatory Visit (HOSPITAL_COMMUNITY): Payer: Self-pay

## 2023-06-14 ENCOUNTER — Other Ambulatory Visit: Payer: Self-pay | Admitting: Gastroenterology

## 2023-06-14 ENCOUNTER — Other Ambulatory Visit: Payer: Self-pay

## 2023-06-14 MED ORDER — PREGABALIN 150 MG PO CAPS
150.0000 mg | ORAL_CAPSULE | Freq: Two times a day (BID) | ORAL | 1 refills | Status: DC
  Filled 2023-06-14: qty 180, 90d supply, fill #0
  Filled 2023-09-16: qty 180, 90d supply, fill #1

## 2023-06-14 MED ORDER — SUCRALFATE 1 G PO TABS
1.0000 g | ORAL_TABLET | Freq: Every day | ORAL | 3 refills | Status: DC
  Filled 2023-06-14: qty 90, 90d supply, fill #0
  Filled 2023-09-17: qty 90, 90d supply, fill #1
  Filled 2023-12-11: qty 90, 90d supply, fill #2
  Filled 2024-03-10: qty 90, 90d supply, fill #3

## 2023-06-14 MED ORDER — PANTOPRAZOLE SODIUM 40 MG PO TBEC
40.0000 mg | DELAYED_RELEASE_TABLET | Freq: Two times a day (BID) | ORAL | 0 refills | Status: DC
  Filled 2023-06-14: qty 60, 30d supply, fill #0

## 2023-06-21 ENCOUNTER — Other Ambulatory Visit (HOSPITAL_COMMUNITY): Payer: Self-pay

## 2023-06-21 MED ORDER — HYDROCODONE-ACETAMINOPHEN 10-325 MG PO TABS
1.0000 | ORAL_TABLET | ORAL | 0 refills | Status: AC | PRN
  Filled 2023-06-21: qty 150, 25d supply, fill #0

## 2023-06-21 MED ORDER — METHOCARBAMOL 750 MG PO TABS
750.0000 mg | ORAL_TABLET | Freq: Three times a day (TID) | ORAL | 3 refills | Status: DC
  Filled 2023-06-21: qty 90, 30d supply, fill #0
  Filled 2023-07-19: qty 90, 30d supply, fill #1
  Filled 2023-08-22: qty 90, 30d supply, fill #2
  Filled 2023-09-17: qty 90, 30d supply, fill #3

## 2023-06-22 ENCOUNTER — Other Ambulatory Visit (HOSPITAL_COMMUNITY): Payer: Self-pay

## 2023-06-24 ENCOUNTER — Other Ambulatory Visit (HOSPITAL_COMMUNITY): Payer: Self-pay

## 2023-07-03 ENCOUNTER — Other Ambulatory Visit (HOSPITAL_COMMUNITY): Payer: Self-pay

## 2023-07-03 ENCOUNTER — Other Ambulatory Visit (HOSPITAL_BASED_OUTPATIENT_CLINIC_OR_DEPARTMENT_OTHER): Payer: Self-pay

## 2023-07-03 MED ORDER — CHLORTHALIDONE 25 MG PO TABS
25.0000 mg | ORAL_TABLET | Freq: Every day | ORAL | 3 refills | Status: DC
  Filled 2023-07-03 – 2023-07-12 (×2): qty 90, 90d supply, fill #0
  Filled 2023-10-10 – 2023-10-21 (×2): qty 90, 90d supply, fill #1
  Filled 2023-10-21 – 2024-01-23 (×2): qty 90, 90d supply, fill #2
  Filled 2024-04-21 (×2): qty 90, 90d supply, fill #3

## 2023-07-04 ENCOUNTER — Other Ambulatory Visit (HOSPITAL_BASED_OUTPATIENT_CLINIC_OR_DEPARTMENT_OTHER): Payer: Self-pay

## 2023-07-04 ENCOUNTER — Other Ambulatory Visit: Payer: Self-pay

## 2023-07-05 ENCOUNTER — Other Ambulatory Visit (HOSPITAL_COMMUNITY): Payer: Self-pay

## 2023-07-09 ENCOUNTER — Other Ambulatory Visit (HOSPITAL_COMMUNITY): Payer: Self-pay

## 2023-07-09 MED ORDER — LORAZEPAM 0.5 MG PO TABS
0.5000 mg | ORAL_TABLET | Freq: Three times a day (TID) | ORAL | 0 refills | Status: DC | PRN
  Filled 2023-07-09: qty 90, 30d supply, fill #0

## 2023-07-12 ENCOUNTER — Other Ambulatory Visit (HOSPITAL_COMMUNITY): Payer: Self-pay

## 2023-07-12 ENCOUNTER — Other Ambulatory Visit: Payer: Self-pay | Admitting: Gastroenterology

## 2023-07-15 ENCOUNTER — Other Ambulatory Visit (HOSPITAL_BASED_OUTPATIENT_CLINIC_OR_DEPARTMENT_OTHER): Payer: Self-pay

## 2023-07-16 ENCOUNTER — Other Ambulatory Visit (HOSPITAL_COMMUNITY): Payer: Self-pay

## 2023-07-19 ENCOUNTER — Other Ambulatory Visit: Payer: Self-pay | Admitting: Gastroenterology

## 2023-07-19 ENCOUNTER — Other Ambulatory Visit (HOSPITAL_COMMUNITY): Payer: Self-pay

## 2023-07-19 MED ORDER — ONDANSETRON 8 MG PO TBDP
8.0000 mg | ORAL_TABLET | Freq: Three times a day (TID) | ORAL | 1 refills | Status: DC
  Filled 2023-07-19: qty 30, 10d supply, fill #0
  Filled 2023-08-01: qty 30, 10d supply, fill #1

## 2023-07-20 ENCOUNTER — Other Ambulatory Visit (HOSPITAL_COMMUNITY): Payer: Self-pay

## 2023-07-22 ENCOUNTER — Other Ambulatory Visit (HOSPITAL_COMMUNITY): Payer: Self-pay

## 2023-07-23 ENCOUNTER — Other Ambulatory Visit (HOSPITAL_COMMUNITY): Payer: Self-pay

## 2023-07-23 MED ORDER — HYDROCODONE-ACETAMINOPHEN 10-325 MG PO TABS
1.0000 | ORAL_TABLET | ORAL | 0 refills | Status: DC | PRN
  Filled 2023-07-23: qty 150, 25d supply, fill #0

## 2023-08-01 ENCOUNTER — Other Ambulatory Visit: Payer: Self-pay | Admitting: Gastroenterology

## 2023-08-01 ENCOUNTER — Other Ambulatory Visit (HOSPITAL_COMMUNITY): Payer: Self-pay

## 2023-08-01 DIAGNOSIS — R1310 Dysphagia, unspecified: Secondary | ICD-10-CM

## 2023-08-02 ENCOUNTER — Other Ambulatory Visit: Payer: Self-pay

## 2023-08-02 ENCOUNTER — Other Ambulatory Visit (HOSPITAL_COMMUNITY): Payer: Self-pay

## 2023-08-02 MED ORDER — PANTOPRAZOLE SODIUM 40 MG PO TBEC
40.0000 mg | DELAYED_RELEASE_TABLET | Freq: Two times a day (BID) | ORAL | 0 refills | Status: AC
  Filled 2023-08-02: qty 60, 30d supply, fill #0

## 2023-08-02 MED ORDER — LOSARTAN POTASSIUM 100 MG PO TABS
100.0000 mg | ORAL_TABLET | Freq: Every day | ORAL | 3 refills | Status: DC
  Filled 2023-08-02: qty 90, 90d supply, fill #0
  Filled 2023-11-18: qty 90, 90d supply, fill #1
  Filled 2024-02-12: qty 90, 90d supply, fill #2
  Filled 2024-05-13: qty 90, 90d supply, fill #3

## 2023-08-02 MED ORDER — METOCLOPRAMIDE HCL 5 MG PO TABS
5.0000 mg | ORAL_TABLET | Freq: Every day | ORAL | 1 refills | Status: AC
Start: 2023-08-02 — End: ?
  Filled 2023-08-02: qty 30, 30d supply, fill #0
  Filled 2023-09-17: qty 30, 30d supply, fill #1

## 2023-08-07 ENCOUNTER — Other Ambulatory Visit (HOSPITAL_COMMUNITY): Payer: Self-pay

## 2023-08-08 ENCOUNTER — Other Ambulatory Visit (HOSPITAL_COMMUNITY): Payer: Self-pay

## 2023-08-08 MED ORDER — LORAZEPAM 0.5 MG PO TABS
ORAL_TABLET | ORAL | 0 refills | Status: DC
  Filled 2023-08-08: qty 90, 30d supply, fill #0

## 2023-08-22 ENCOUNTER — Other Ambulatory Visit (HOSPITAL_COMMUNITY): Payer: Self-pay

## 2023-08-23 ENCOUNTER — Other Ambulatory Visit (HOSPITAL_COMMUNITY): Payer: Self-pay

## 2023-08-23 MED ORDER — HYDROCODONE-ACETAMINOPHEN 10-325 MG PO TABS
1.0000 | ORAL_TABLET | ORAL | 0 refills | Status: DC | PRN
  Filled 2023-08-23: qty 150, 25d supply, fill #0

## 2023-09-05 ENCOUNTER — Other Ambulatory Visit (HOSPITAL_COMMUNITY): Payer: Self-pay

## 2023-09-06 ENCOUNTER — Other Ambulatory Visit (HOSPITAL_BASED_OUTPATIENT_CLINIC_OR_DEPARTMENT_OTHER): Payer: Self-pay

## 2023-09-06 ENCOUNTER — Other Ambulatory Visit (HOSPITAL_COMMUNITY): Payer: Self-pay

## 2023-09-06 MED ORDER — LORAZEPAM 0.5 MG PO TABS
0.5000 mg | ORAL_TABLET | Freq: Three times a day (TID) | ORAL | 0 refills | Status: DC | PRN
  Filled 2023-09-06: qty 90, 30d supply, fill #0

## 2023-09-17 ENCOUNTER — Other Ambulatory Visit (HOSPITAL_COMMUNITY): Payer: Self-pay

## 2023-09-17 ENCOUNTER — Encounter (HOSPITAL_COMMUNITY): Payer: Self-pay

## 2023-09-17 ENCOUNTER — Other Ambulatory Visit: Payer: Self-pay | Admitting: Gastroenterology

## 2023-09-21 ENCOUNTER — Other Ambulatory Visit (HOSPITAL_BASED_OUTPATIENT_CLINIC_OR_DEPARTMENT_OTHER): Payer: Self-pay

## 2023-09-21 ENCOUNTER — Other Ambulatory Visit (HOSPITAL_COMMUNITY): Payer: Self-pay

## 2023-09-21 MED ORDER — HYDROCODONE-ACETAMINOPHEN 10-325 MG PO TABS
1.0000 | ORAL_TABLET | ORAL | 0 refills | Status: DC | PRN
  Filled 2023-09-21: qty 150, 25d supply, fill #0

## 2023-09-23 ENCOUNTER — Other Ambulatory Visit (HOSPITAL_COMMUNITY): Payer: Self-pay

## 2023-09-30 ENCOUNTER — Other Ambulatory Visit (HOSPITAL_COMMUNITY): Payer: Self-pay

## 2023-09-30 DIAGNOSIS — R1084 Generalized abdominal pain: Secondary | ICD-10-CM | POA: Diagnosis not present

## 2023-09-30 MED ORDER — TAMSULOSIN HCL 0.4 MG PO CAPS
ORAL_CAPSULE | Freq: Every evening | ORAL | 0 refills | Status: AC
  Filled 2023-09-30: qty 30, 30d supply, fill #0

## 2023-10-01 ENCOUNTER — Other Ambulatory Visit (HOSPITAL_BASED_OUTPATIENT_CLINIC_OR_DEPARTMENT_OTHER): Payer: Self-pay

## 2023-10-02 ENCOUNTER — Other Ambulatory Visit (HOSPITAL_BASED_OUTPATIENT_CLINIC_OR_DEPARTMENT_OTHER): Payer: Self-pay

## 2023-10-02 MED ORDER — SUMATRIPTAN SUCCINATE 100 MG PO TABS
100.0000 mg | ORAL_TABLET | ORAL | 3 refills | Status: AC | PRN
  Filled 2023-10-02 – 2023-10-07 (×2): qty 27, 90d supply, fill #0
  Filled 2024-02-12: qty 27, 90d supply, fill #1
  Filled 2024-05-20: qty 27, 90d supply, fill #2
  Filled 2024-08-07: qty 27, 90d supply, fill #3

## 2023-10-07 ENCOUNTER — Other Ambulatory Visit (HOSPITAL_COMMUNITY): Payer: Self-pay

## 2023-10-07 ENCOUNTER — Other Ambulatory Visit (HOSPITAL_BASED_OUTPATIENT_CLINIC_OR_DEPARTMENT_OTHER): Payer: Self-pay

## 2023-10-08 ENCOUNTER — Other Ambulatory Visit (HOSPITAL_COMMUNITY): Payer: Self-pay

## 2023-10-09 ENCOUNTER — Other Ambulatory Visit (HOSPITAL_COMMUNITY): Payer: Self-pay

## 2023-10-09 MED ORDER — LORAZEPAM 0.5 MG PO TABS
ORAL_TABLET | ORAL | 0 refills | Status: AC
  Filled 2023-10-09: qty 90, 30d supply, fill #0

## 2023-10-10 ENCOUNTER — Other Ambulatory Visit: Payer: Self-pay | Admitting: Gastroenterology

## 2023-10-21 ENCOUNTER — Other Ambulatory Visit (HOSPITAL_COMMUNITY): Payer: Self-pay

## 2023-10-22 ENCOUNTER — Other Ambulatory Visit (HOSPITAL_BASED_OUTPATIENT_CLINIC_OR_DEPARTMENT_OTHER): Payer: Self-pay

## 2023-10-22 ENCOUNTER — Other Ambulatory Visit (HOSPITAL_COMMUNITY): Payer: Self-pay

## 2023-10-22 MED ORDER — ZOLPIDEM TARTRATE 10 MG PO TABS
10.0000 mg | ORAL_TABLET | Freq: Every evening | ORAL | 1 refills | Status: DC | PRN
Start: 2023-10-22 — End: 2024-02-26
  Filled 2023-10-22: qty 30, 30d supply, fill #0
  Filled 2023-12-25: qty 30, 30d supply, fill #1

## 2023-10-23 ENCOUNTER — Other Ambulatory Visit (HOSPITAL_COMMUNITY): Payer: Self-pay

## 2023-10-23 MED ORDER — METHOCARBAMOL 750 MG PO TABS
750.0000 mg | ORAL_TABLET | Freq: Three times a day (TID) | ORAL | 3 refills | Status: DC
  Filled 2023-10-23: qty 90, 30d supply, fill #0
  Filled 2023-11-18: qty 90, 30d supply, fill #1
  Filled 2023-12-25: qty 90, 30d supply, fill #2
  Filled 2024-01-23: qty 90, 30d supply, fill #3

## 2023-10-24 ENCOUNTER — Other Ambulatory Visit (HOSPITAL_COMMUNITY): Payer: Self-pay

## 2023-10-25 ENCOUNTER — Other Ambulatory Visit (HOSPITAL_COMMUNITY): Payer: Self-pay

## 2023-10-25 MED ORDER — HYDROCODONE-ACETAMINOPHEN 10-325 MG PO TABS
1.0000 | ORAL_TABLET | ORAL | 0 refills | Status: DC | PRN
  Filled 2023-10-25: qty 150, 25d supply, fill #0

## 2023-11-06 ENCOUNTER — Other Ambulatory Visit (HOSPITAL_COMMUNITY): Payer: Self-pay

## 2023-11-08 ENCOUNTER — Other Ambulatory Visit (HOSPITAL_COMMUNITY): Payer: Self-pay

## 2023-11-11 ENCOUNTER — Other Ambulatory Visit (HOSPITAL_COMMUNITY): Payer: Self-pay

## 2023-11-11 MED ORDER — LORAZEPAM 0.5 MG PO TABS
0.5000 mg | ORAL_TABLET | Freq: Three times a day (TID) | ORAL | 0 refills | Status: DC | PRN
Start: 2023-11-11 — End: 2023-12-13
  Filled 2023-11-11: qty 90, 30d supply, fill #0

## 2023-11-18 ENCOUNTER — Other Ambulatory Visit (HOSPITAL_COMMUNITY): Payer: Self-pay

## 2023-11-19 ENCOUNTER — Other Ambulatory Visit (HOSPITAL_COMMUNITY): Payer: Self-pay

## 2023-11-19 MED ORDER — ONDANSETRON 8 MG PO TBDP
8.0000 mg | ORAL_TABLET | Freq: Three times a day (TID) | ORAL | 1 refills | Status: DC
  Filled 2023-11-19: qty 30, 10d supply, fill #0
  Filled 2023-11-26 – 2023-11-27 (×2): qty 30, 10d supply, fill #1

## 2023-11-25 ENCOUNTER — Other Ambulatory Visit (HOSPITAL_COMMUNITY): Payer: Self-pay

## 2023-11-25 MED ORDER — HYDROCODONE-ACETAMINOPHEN 10-325 MG PO TABS
1.0000 | ORAL_TABLET | ORAL | 0 refills | Status: DC | PRN
  Filled 2023-11-25: qty 150, 25d supply, fill #0

## 2023-11-26 ENCOUNTER — Other Ambulatory Visit (HOSPITAL_COMMUNITY): Payer: Self-pay

## 2023-11-27 ENCOUNTER — Other Ambulatory Visit: Payer: Self-pay

## 2023-11-30 ENCOUNTER — Other Ambulatory Visit (HOSPITAL_COMMUNITY): Payer: Self-pay

## 2023-12-01 ENCOUNTER — Other Ambulatory Visit (HOSPITAL_COMMUNITY): Payer: Self-pay

## 2023-12-02 ENCOUNTER — Other Ambulatory Visit (HOSPITAL_COMMUNITY): Payer: Self-pay

## 2023-12-02 MED ORDER — LIDOCAINE VISCOUS HCL 2 % MT SOLN
OROMUCOSAL | 1 refills | Status: DC
  Filled 2023-12-02: qty 100, 7d supply, fill #0
  Filled 2023-12-25: qty 100, 7d supply, fill #1

## 2023-12-10 ENCOUNTER — Other Ambulatory Visit (HOSPITAL_COMMUNITY): Payer: Self-pay

## 2023-12-10 MED ORDER — SCOPOLAMINE 1 MG/3DAYS TD PT72
MEDICATED_PATCH | TRANSDERMAL | 1 refills | Status: AC
Start: 2023-12-10 — End: ?
  Filled 2023-12-10: qty 4, 12d supply, fill #0
  Filled 2024-06-19: qty 4, 12d supply, fill #1

## 2023-12-11 ENCOUNTER — Other Ambulatory Visit (HOSPITAL_COMMUNITY): Payer: Self-pay

## 2023-12-13 ENCOUNTER — Other Ambulatory Visit (HOSPITAL_COMMUNITY): Payer: Self-pay

## 2023-12-13 MED ORDER — LORAZEPAM 0.5 MG PO TABS
0.5000 mg | ORAL_TABLET | Freq: Three times a day (TID) | ORAL | 0 refills | Status: DC
  Filled 2023-12-13: qty 90, 30d supply, fill #0

## 2023-12-25 ENCOUNTER — Other Ambulatory Visit: Payer: Self-pay

## 2023-12-25 ENCOUNTER — Other Ambulatory Visit (HOSPITAL_COMMUNITY): Payer: Self-pay

## 2023-12-26 ENCOUNTER — Other Ambulatory Visit (HOSPITAL_COMMUNITY): Payer: Self-pay

## 2023-12-26 MED ORDER — HYDROCODONE-ACETAMINOPHEN 10-325 MG PO TABS
1.0000 | ORAL_TABLET | ORAL | 0 refills | Status: DC | PRN
  Filled 2023-12-26: qty 150, 25d supply, fill #0

## 2023-12-26 MED ORDER — PREGABALIN 150 MG PO CAPS
ORAL_CAPSULE | ORAL | 1 refills | Status: AC
  Filled 2023-12-26: qty 180, 90d supply, fill #0
  Filled 2024-03-31: qty 180, 90d supply, fill #1

## 2024-01-13 ENCOUNTER — Other Ambulatory Visit (HOSPITAL_COMMUNITY): Payer: Self-pay

## 2024-01-14 ENCOUNTER — Other Ambulatory Visit (HOSPITAL_COMMUNITY): Payer: Self-pay

## 2024-01-14 MED ORDER — LORAZEPAM 0.5 MG PO TABS
0.5000 mg | ORAL_TABLET | Freq: Three times a day (TID) | ORAL | 0 refills | Status: DC | PRN
  Filled 2024-01-14: qty 90, 30d supply, fill #0

## 2024-01-24 ENCOUNTER — Other Ambulatory Visit (HOSPITAL_COMMUNITY): Payer: Self-pay

## 2024-01-24 DIAGNOSIS — I10 Essential (primary) hypertension: Secondary | ICD-10-CM | POA: Diagnosis not present

## 2024-01-24 DIAGNOSIS — E559 Vitamin D deficiency, unspecified: Secondary | ICD-10-CM | POA: Diagnosis not present

## 2024-01-24 MED ORDER — HYDROCODONE-ACETAMINOPHEN 10-325 MG PO TABS
1.0000 | ORAL_TABLET | ORAL | 0 refills | Status: DC | PRN
  Filled 2024-01-24: qty 150, 25d supply, fill #0

## 2024-02-04 ENCOUNTER — Other Ambulatory Visit (HOSPITAL_COMMUNITY): Payer: Self-pay

## 2024-02-05 MED ORDER — LIDOCAINE VISCOUS HCL 2 % MT SOLN
OROMUCOSAL | 1 refills | Status: DC
  Filled 2024-02-05: qty 100, 6d supply, fill #0
  Filled 2024-03-10: qty 100, 6d supply, fill #1

## 2024-02-05 MED ORDER — ONDANSETRON 8 MG PO TBDP
8.0000 mg | ORAL_TABLET | Freq: Three times a day (TID) | ORAL | 1 refills | Status: DC
  Filled 2024-02-05: qty 30, 10d supply, fill #0
  Filled 2024-03-10: qty 30, 10d supply, fill #1

## 2024-02-06 ENCOUNTER — Other Ambulatory Visit (HOSPITAL_COMMUNITY): Payer: Self-pay

## 2024-02-17 ENCOUNTER — Other Ambulatory Visit (HOSPITAL_COMMUNITY): Payer: Self-pay

## 2024-02-18 ENCOUNTER — Other Ambulatory Visit (HOSPITAL_COMMUNITY): Payer: Self-pay

## 2024-02-18 MED ORDER — LORAZEPAM 0.5 MG PO TABS
0.5000 mg | ORAL_TABLET | Freq: Three times a day (TID) | ORAL | 0 refills | Status: DC | PRN
  Filled 2024-02-18: qty 90, 30d supply, fill #0

## 2024-02-25 ENCOUNTER — Other Ambulatory Visit (HOSPITAL_COMMUNITY): Payer: Self-pay

## 2024-02-25 MED ORDER — METHOCARBAMOL 750 MG PO TABS
750.0000 mg | ORAL_TABLET | Freq: Three times a day (TID) | ORAL | 3 refills | Status: AC
  Filled 2024-02-25 – 2024-03-22 (×2): qty 90, 30d supply, fill #0
  Filled 2024-04-19: qty 90, 30d supply, fill #1
  Filled 2024-05-23: qty 90, 30d supply, fill #2
  Filled 2024-06-19: qty 90, 30d supply, fill #3

## 2024-02-26 ENCOUNTER — Other Ambulatory Visit (HOSPITAL_COMMUNITY): Payer: Self-pay

## 2024-02-26 MED ORDER — HYDROCODONE-ACETAMINOPHEN 10-325 MG PO TABS
1.0000 | ORAL_TABLET | ORAL | 0 refills | Status: DC | PRN
  Filled 2024-02-26: qty 150, 25d supply, fill #0

## 2024-02-26 MED ORDER — ZOLPIDEM TARTRATE 10 MG PO TABS
10.0000 mg | ORAL_TABLET | Freq: Every evening | ORAL | 1 refills | Status: DC | PRN
  Filled 2024-02-26: qty 30, 30d supply, fill #0
  Filled 2024-04-07: qty 30, 30d supply, fill #1

## 2024-02-26 MED ORDER — METHOCARBAMOL 750 MG PO TABS
750.0000 mg | ORAL_TABLET | Freq: Three times a day (TID) | ORAL | 3 refills | Status: AC
  Filled 2024-02-26: qty 90, 30d supply, fill #0
  Filled 2024-07-20: qty 90, 30d supply, fill #1
  Filled 2024-08-18: qty 90, 30d supply, fill #2
  Filled 2024-09-18: qty 90, 30d supply, fill #3

## 2024-03-06 ENCOUNTER — Other Ambulatory Visit (HOSPITAL_COMMUNITY): Payer: Self-pay

## 2024-03-07 ENCOUNTER — Other Ambulatory Visit (HOSPITAL_COMMUNITY): Payer: Self-pay

## 2024-03-07 MED ORDER — FAMOTIDINE 40 MG PO TABS
40.0000 mg | ORAL_TABLET | Freq: Every day | ORAL | 3 refills | Status: AC
  Filled 2024-03-07: qty 90, 90d supply, fill #0
  Filled 2024-06-19: qty 90, 90d supply, fill #1
  Filled 2024-09-18: qty 90, 90d supply, fill #2

## 2024-03-09 ENCOUNTER — Other Ambulatory Visit (HOSPITAL_COMMUNITY): Payer: Self-pay

## 2024-03-16 ENCOUNTER — Other Ambulatory Visit (HOSPITAL_COMMUNITY): Payer: Self-pay

## 2024-03-16 MED ORDER — MUPIROCIN 2 % EX OINT
1.0000 | TOPICAL_OINTMENT | Freq: Three times a day (TID) | CUTANEOUS | 3 refills | Status: AC
  Filled 2024-03-16: qty 22, 30d supply, fill #0
  Filled 2024-05-20: qty 22, 30d supply, fill #1
  Filled 2024-06-19: qty 22, 30d supply, fill #2

## 2024-03-19 ENCOUNTER — Other Ambulatory Visit (HOSPITAL_COMMUNITY): Payer: Self-pay

## 2024-03-20 ENCOUNTER — Other Ambulatory Visit (HOSPITAL_COMMUNITY): Payer: Self-pay

## 2024-03-20 MED ORDER — LORAZEPAM 0.5 MG PO TABS
0.5000 mg | ORAL_TABLET | Freq: Three times a day (TID) | ORAL | 0 refills | Status: DC | PRN
  Filled 2024-03-20: qty 90, 30d supply, fill #0

## 2024-03-22 ENCOUNTER — Other Ambulatory Visit: Payer: Self-pay | Admitting: Gastroenterology

## 2024-03-22 DIAGNOSIS — R1310 Dysphagia, unspecified: Secondary | ICD-10-CM

## 2024-03-23 ENCOUNTER — Other Ambulatory Visit (HOSPITAL_COMMUNITY): Payer: Self-pay

## 2024-03-31 ENCOUNTER — Other Ambulatory Visit: Payer: Self-pay

## 2024-03-31 ENCOUNTER — Other Ambulatory Visit (HOSPITAL_COMMUNITY): Payer: Self-pay

## 2024-03-31 MED ORDER — HYDROCODONE-ACETAMINOPHEN 10-325 MG PO TABS
1.0000 | ORAL_TABLET | ORAL | 0 refills | Status: DC | PRN
  Filled 2024-03-31: qty 150, 25d supply, fill #0

## 2024-04-07 ENCOUNTER — Other Ambulatory Visit: Payer: Self-pay

## 2024-04-09 ENCOUNTER — Other Ambulatory Visit (HOSPITAL_COMMUNITY): Payer: Self-pay

## 2024-04-09 ENCOUNTER — Encounter (HOSPITAL_COMMUNITY): Payer: Self-pay

## 2024-04-14 ENCOUNTER — Other Ambulatory Visit (HOSPITAL_COMMUNITY): Payer: Self-pay

## 2024-04-14 MED ORDER — LIDOCAINE VISCOUS HCL 2 % MT SOLN
OROMUCOSAL | 1 refills | Status: DC
  Filled 2024-04-14: qty 100, 10d supply, fill #0
  Filled 2024-05-13: qty 100, 10d supply, fill #1

## 2024-04-19 ENCOUNTER — Other Ambulatory Visit (HOSPITAL_COMMUNITY): Payer: Self-pay

## 2024-04-20 ENCOUNTER — Other Ambulatory Visit: Payer: Self-pay

## 2024-04-21 ENCOUNTER — Other Ambulatory Visit (HOSPITAL_COMMUNITY): Payer: Self-pay

## 2024-04-21 ENCOUNTER — Other Ambulatory Visit (HOSPITAL_BASED_OUTPATIENT_CLINIC_OR_DEPARTMENT_OTHER): Payer: Self-pay

## 2024-04-21 MED ORDER — LORAZEPAM 0.5 MG PO TABS
0.5000 mg | ORAL_TABLET | Freq: Three times a day (TID) | ORAL | 0 refills | Status: DC | PRN
  Filled 2024-04-21: qty 90, 30d supply, fill #0

## 2024-04-28 ENCOUNTER — Other Ambulatory Visit (HOSPITAL_COMMUNITY): Payer: Self-pay

## 2024-04-29 ENCOUNTER — Other Ambulatory Visit (HOSPITAL_COMMUNITY): Payer: Self-pay

## 2024-04-30 ENCOUNTER — Other Ambulatory Visit (HOSPITAL_COMMUNITY): Payer: Self-pay

## 2024-05-01 ENCOUNTER — Other Ambulatory Visit (HOSPITAL_COMMUNITY): Payer: Self-pay

## 2024-05-01 MED ORDER — HYDROCODONE-ACETAMINOPHEN 10-325 MG PO TABS
1.0000 | ORAL_TABLET | ORAL | 0 refills | Status: AC | PRN
  Filled 2024-05-01: qty 150, 25d supply, fill #0

## 2024-05-01 MED ORDER — ONDANSETRON 8 MG PO TBDP
8.0000 mg | ORAL_TABLET | Freq: Three times a day (TID) | ORAL | 1 refills | Status: DC
  Filled 2024-05-01: qty 30, 10d supply, fill #0
  Filled 2024-05-20: qty 30, 10d supply, fill #1

## 2024-05-13 ENCOUNTER — Other Ambulatory Visit (HOSPITAL_COMMUNITY): Payer: Self-pay

## 2024-05-14 ENCOUNTER — Other Ambulatory Visit (HOSPITAL_COMMUNITY): Payer: Self-pay

## 2024-05-14 MED ORDER — ZOLPIDEM TARTRATE 10 MG PO TABS
10.0000 mg | ORAL_TABLET | Freq: Every evening | ORAL | 1 refills | Status: DC | PRN
  Filled 2024-05-14: qty 30, 30d supply, fill #0
  Filled 2024-07-13: qty 30, 30d supply, fill #1

## 2024-05-20 ENCOUNTER — Other Ambulatory Visit (HOSPITAL_COMMUNITY): Payer: Self-pay

## 2024-05-21 ENCOUNTER — Other Ambulatory Visit (HOSPITAL_COMMUNITY): Payer: Self-pay

## 2024-05-21 MED ORDER — SUCRALFATE 1 G PO TABS
1.0000 g | ORAL_TABLET | Freq: Every day | ORAL | 3 refills | Status: AC
  Filled 2024-05-21: qty 90, 90d supply, fill #0
  Filled 2024-08-07 – 2024-08-18 (×2): qty 90, 90d supply, fill #1

## 2024-05-23 ENCOUNTER — Other Ambulatory Visit (HOSPITAL_COMMUNITY): Payer: Self-pay

## 2024-05-27 ENCOUNTER — Other Ambulatory Visit (HOSPITAL_COMMUNITY): Payer: Self-pay

## 2024-05-27 MED ORDER — LORAZEPAM 0.5 MG PO TABS
0.5000 mg | ORAL_TABLET | Freq: Three times a day (TID) | ORAL | 0 refills | Status: DC | PRN
  Filled 2024-05-27: qty 90, 30d supply, fill #0

## 2024-06-04 ENCOUNTER — Other Ambulatory Visit (HOSPITAL_COMMUNITY): Payer: Self-pay

## 2024-06-04 MED ORDER — HYDROCODONE-ACETAMINOPHEN 10-325 MG PO TABS
ORAL_TABLET | ORAL | 0 refills | Status: AC
  Filled 2024-06-04: qty 150, 25d supply, fill #0

## 2024-06-25 ENCOUNTER — Other Ambulatory Visit (HOSPITAL_BASED_OUTPATIENT_CLINIC_OR_DEPARTMENT_OTHER): Payer: Self-pay

## 2024-06-26 ENCOUNTER — Other Ambulatory Visit (HOSPITAL_BASED_OUTPATIENT_CLINIC_OR_DEPARTMENT_OTHER): Payer: Self-pay

## 2024-06-26 MED ORDER — PREGABALIN 150 MG PO CAPS
150.0000 mg | ORAL_CAPSULE | Freq: Two times a day (BID) | ORAL | 1 refills | Status: AC
  Filled 2024-06-26 – 2024-06-29 (×3): qty 180, 90d supply, fill #0
  Filled 2024-09-23: qty 133, 66d supply, fill #1
  Filled 2024-09-23: qty 47, 24d supply, fill #1

## 2024-06-27 ENCOUNTER — Other Ambulatory Visit (HOSPITAL_COMMUNITY): Payer: Self-pay

## 2024-06-29 ENCOUNTER — Other Ambulatory Visit (HOSPITAL_COMMUNITY): Payer: Self-pay

## 2024-06-29 ENCOUNTER — Other Ambulatory Visit (HOSPITAL_BASED_OUTPATIENT_CLINIC_OR_DEPARTMENT_OTHER): Payer: Self-pay

## 2024-06-30 ENCOUNTER — Other Ambulatory Visit: Payer: Self-pay

## 2024-07-01 ENCOUNTER — Other Ambulatory Visit (HOSPITAL_COMMUNITY): Payer: Self-pay

## 2024-07-03 ENCOUNTER — Other Ambulatory Visit: Payer: Self-pay

## 2024-07-03 ENCOUNTER — Other Ambulatory Visit (HOSPITAL_COMMUNITY): Payer: Self-pay

## 2024-07-03 MED ORDER — LORAZEPAM 0.5 MG PO TABS
0.5000 mg | ORAL_TABLET | Freq: Three times a day (TID) | ORAL | 0 refills | Status: DC | PRN
  Filled 2024-07-03: qty 90, 30d supply, fill #0

## 2024-07-13 ENCOUNTER — Other Ambulatory Visit: Payer: Self-pay

## 2024-07-13 ENCOUNTER — Other Ambulatory Visit (HOSPITAL_BASED_OUTPATIENT_CLINIC_OR_DEPARTMENT_OTHER): Payer: Self-pay

## 2024-07-13 MED ORDER — HYDROCODONE-ACETAMINOPHEN 10-325 MG PO TABS
1.0000 | ORAL_TABLET | ORAL | 0 refills | Status: DC | PRN
  Filled 2024-07-13 – 2024-07-15 (×3): qty 150, 25d supply, fill #0

## 2024-07-14 ENCOUNTER — Other Ambulatory Visit (HOSPITAL_BASED_OUTPATIENT_CLINIC_OR_DEPARTMENT_OTHER): Payer: Self-pay

## 2024-07-14 ENCOUNTER — Other Ambulatory Visit (HOSPITAL_COMMUNITY): Payer: Self-pay

## 2024-07-15 ENCOUNTER — Other Ambulatory Visit (HOSPITAL_COMMUNITY): Payer: Self-pay

## 2024-07-15 ENCOUNTER — Other Ambulatory Visit: Payer: Self-pay

## 2024-07-15 MED ORDER — ONDANSETRON 8 MG PO TBDP
8.0000 mg | ORAL_TABLET | Freq: Three times a day (TID) | ORAL | 1 refills | Status: AC
  Filled 2024-07-15: qty 30, 10d supply, fill #0
  Filled 2024-09-13: qty 30, 10d supply, fill #1

## 2024-07-16 ENCOUNTER — Other Ambulatory Visit (HOSPITAL_COMMUNITY): Payer: Self-pay

## 2024-07-16 MED ORDER — LIDOCAINE VISCOUS HCL 2 % MT SOLN
OROMUCOSAL | 1 refills | Status: AC
  Filled 2024-07-16: qty 100, 6d supply, fill #0
  Filled 2024-08-07: qty 100, 6d supply, fill #1

## 2024-07-16 MED ORDER — CHLORTHALIDONE 25 MG PO TABS
25.0000 mg | ORAL_TABLET | Freq: Every day | ORAL | 3 refills | Status: AC
  Filled 2024-07-16: qty 90, 90d supply, fill #0

## 2024-07-17 ENCOUNTER — Other Ambulatory Visit (HOSPITAL_BASED_OUTPATIENT_CLINIC_OR_DEPARTMENT_OTHER): Payer: Self-pay

## 2024-08-07 ENCOUNTER — Other Ambulatory Visit (HOSPITAL_COMMUNITY): Payer: Self-pay

## 2024-08-09 ENCOUNTER — Other Ambulatory Visit (HOSPITAL_COMMUNITY): Payer: Self-pay

## 2024-08-10 ENCOUNTER — Other Ambulatory Visit (HOSPITAL_COMMUNITY): Payer: Self-pay

## 2024-08-10 ENCOUNTER — Other Ambulatory Visit: Payer: Self-pay

## 2024-08-10 MED ORDER — LOSARTAN POTASSIUM 100 MG PO TABS
100.0000 mg | ORAL_TABLET | Freq: Every day | ORAL | 3 refills | Status: AC
  Filled 2024-08-10: qty 90, 90d supply, fill #0

## 2024-08-13 DIAGNOSIS — R058 Other specified cough: Secondary | ICD-10-CM | POA: Diagnosis not present

## 2024-08-13 DIAGNOSIS — G43909 Migraine, unspecified, not intractable, without status migrainosus: Secondary | ICD-10-CM | POA: Diagnosis not present

## 2024-08-13 DIAGNOSIS — D638 Anemia in other chronic diseases classified elsewhere: Secondary | ICD-10-CM | POA: Diagnosis not present

## 2024-08-13 DIAGNOSIS — R630 Anorexia: Secondary | ICD-10-CM | POA: Diagnosis not present

## 2024-08-17 ENCOUNTER — Other Ambulatory Visit (HOSPITAL_COMMUNITY): Payer: Self-pay

## 2024-08-18 ENCOUNTER — Other Ambulatory Visit (HOSPITAL_COMMUNITY): Payer: Self-pay

## 2024-08-18 MED ORDER — ZOLPIDEM TARTRATE 10 MG PO TABS
10.0000 mg | ORAL_TABLET | Freq: Every evening | ORAL | 1 refills | Status: AC | PRN
  Filled 2024-08-18: qty 30, 30d supply, fill #0
  Filled 2024-09-20: qty 30, 30d supply, fill #1

## 2024-08-18 MED ORDER — LORAZEPAM 0.5 MG PO TABS
0.5000 mg | ORAL_TABLET | Freq: Three times a day (TID) | ORAL | 0 refills | Status: AC | PRN
  Filled 2024-08-18: qty 90, 30d supply, fill #0

## 2024-08-21 ENCOUNTER — Other Ambulatory Visit (HOSPITAL_COMMUNITY): Payer: Self-pay

## 2024-08-21 MED ORDER — HYDROCODONE-ACETAMINOPHEN 10-325 MG PO TABS
1.0000 | ORAL_TABLET | ORAL | 0 refills | Status: AC | PRN
  Filled 2024-08-21: qty 150, 25d supply, fill #0

## 2024-08-25 ENCOUNTER — Other Ambulatory Visit (HOSPITAL_COMMUNITY): Payer: Self-pay

## 2024-09-18 ENCOUNTER — Other Ambulatory Visit (HOSPITAL_COMMUNITY): Payer: Self-pay

## 2024-09-21 ENCOUNTER — Other Ambulatory Visit: Payer: Self-pay

## 2024-09-23 ENCOUNTER — Other Ambulatory Visit (HOSPITAL_COMMUNITY): Payer: Self-pay
